# Patient Record
Sex: Female | Born: 1950 | Race: Black or African American | Hispanic: No | Marital: Single | Smoking: Never smoker
Health system: Southern US, Community
[De-identification: ages and names within clinical notes are randomized; demographics above are authoritative.]

## PROBLEM LIST (undated history)

## (undated) DIAGNOSIS — E785 Hyperlipidemia, unspecified: Secondary | ICD-10-CM

## (undated) DIAGNOSIS — E13319 Other specified diabetes mellitus with unspecified diabetic retinopathy without macular edema: Secondary | ICD-10-CM

## (undated) DIAGNOSIS — H5440 Blindness, one eye, unspecified eye: Secondary | ICD-10-CM

## (undated) DIAGNOSIS — G629 Polyneuropathy, unspecified: Secondary | ICD-10-CM

## (undated) DIAGNOSIS — I10 Essential (primary) hypertension: Secondary | ICD-10-CM

## (undated) DIAGNOSIS — E119 Type 2 diabetes mellitus without complications: Secondary | ICD-10-CM

## (undated) HISTORY — DX: Essential (primary) hypertension: I10

## (undated) HISTORY — DX: Hyperlipidemia, unspecified: E78.5

## (undated) HISTORY — DX: Other specified diabetes mellitus with unspecified diabetic retinopathy without macular edema: E13.319

## (undated) HISTORY — DX: Type 2 diabetes mellitus without complications: E11.9

## (undated) HISTORY — DX: Polyneuropathy, unspecified: G62.9

## (undated) HISTORY — DX: Blindness, one eye, unspecified eye: H54.40

---

## 1993-08-07 DIAGNOSIS — J45909 Unspecified asthma, uncomplicated: Secondary | ICD-10-CM | POA: Insufficient documentation

## 1998-08-07 DIAGNOSIS — I10 Essential (primary) hypertension: Secondary | ICD-10-CM | POA: Insufficient documentation

## 2001-08-07 DIAGNOSIS — K219 Gastro-esophageal reflux disease without esophagitis: Secondary | ICD-10-CM | POA: Insufficient documentation

## 2005-07-12 DIAGNOSIS — F329 Major depressive disorder, single episode, unspecified: Secondary | ICD-10-CM | POA: Insufficient documentation

## 2005-07-12 DIAGNOSIS — F32A Depression, unspecified: Secondary | ICD-10-CM | POA: Insufficient documentation

## 2005-07-12 DIAGNOSIS — G2581 Restless legs syndrome: Secondary | ICD-10-CM | POA: Insufficient documentation

## 2007-08-08 LAB — HM DIABETES FOOT EXAM: HM Diabetic Foot Exam: 0

## 2008-02-01 DIAGNOSIS — E785 Hyperlipidemia, unspecified: Secondary | ICD-10-CM | POA: Insufficient documentation

## 2008-05-14 ENCOUNTER — Ambulatory Visit: Payer: Self-pay | Admitting: Nurse Practitioner

## 2008-05-14 DIAGNOSIS — E1149 Type 2 diabetes mellitus with other diabetic neurological complication: Secondary | ICD-10-CM | POA: Insufficient documentation

## 2008-05-19 ENCOUNTER — Encounter (INDEPENDENT_AMBULATORY_CARE_PROVIDER_SITE_OTHER): Payer: Self-pay | Admitting: Nurse Practitioner

## 2008-05-26 ENCOUNTER — Encounter (INDEPENDENT_AMBULATORY_CARE_PROVIDER_SITE_OTHER): Payer: Self-pay | Admitting: Nurse Practitioner

## 2008-05-28 ENCOUNTER — Encounter (INDEPENDENT_AMBULATORY_CARE_PROVIDER_SITE_OTHER): Payer: Self-pay | Admitting: Nurse Practitioner

## 2008-05-28 DIAGNOSIS — H547 Unspecified visual loss: Secondary | ICD-10-CM | POA: Insufficient documentation

## 2008-05-29 ENCOUNTER — Encounter (INDEPENDENT_AMBULATORY_CARE_PROVIDER_SITE_OTHER): Payer: Self-pay | Admitting: Nurse Practitioner

## 2008-06-09 ENCOUNTER — Encounter (INDEPENDENT_AMBULATORY_CARE_PROVIDER_SITE_OTHER): Payer: Self-pay | Admitting: Nurse Practitioner

## 2008-06-09 ENCOUNTER — Ambulatory Visit: Payer: Self-pay | Admitting: Nurse Practitioner

## 2008-06-09 LAB — CONVERTED CEMR LAB
AST: 11 units/L (ref 0–37)
Alkaline Phosphatase: 81 units/L (ref 39–117)
Basophils Absolute: 0 10*3/uL (ref 0.0–0.1)
Blood Glucose, Fingerstick: 201
Blood in Urine, dipstick: NEGATIVE
Calcium: 9 mg/dL (ref 8.4–10.5)
Chloride: 96 meq/L (ref 96–112)
Creatinine, Ser: 0.75 mg/dL (ref 0.40–1.20)
Glucose, Bld: 177 mg/dL — ABNORMAL HIGH (ref 70–99)
Glucose, Urine, Semiquant: NEGATIVE
HCT: 41.9 % (ref 36.0–46.0)
LDL Cholesterol: 103 mg/dL — ABNORMAL HIGH (ref 0–99)
Lymphocytes Relative: 48 % — ABNORMAL HIGH (ref 12–46)
Lymphs Abs: 2.9 10*3/uL (ref 0.7–4.0)
MCHC: 33.7 g/dL (ref 30.0–36.0)
MCV: 86.4 fL (ref 78.0–100.0)
Microalb, Ur: 6.39 mg/dL — ABNORMAL HIGH (ref 0.00–1.89)
Monocytes Relative: 6 % (ref 3–12)
Protein, U semiquant: 100
RBC: 4.85 M/uL (ref 3.87–5.11)
RDW: 13.1 % (ref 11.5–15.5)
Total Bilirubin: 1.1 mg/dL (ref 0.3–1.2)
Triglycerides: 85 mg/dL (ref <150)
WBC: 6.1 10*3/uL (ref 4.0–10.5)

## 2008-06-10 ENCOUNTER — Encounter (INDEPENDENT_AMBULATORY_CARE_PROVIDER_SITE_OTHER): Payer: Self-pay | Admitting: Nurse Practitioner

## 2008-06-30 ENCOUNTER — Encounter (INDEPENDENT_AMBULATORY_CARE_PROVIDER_SITE_OTHER): Payer: Self-pay | Admitting: Nurse Practitioner

## 2008-08-05 ENCOUNTER — Ambulatory Visit: Payer: Self-pay | Admitting: Nurse Practitioner

## 2008-08-05 LAB — CONVERTED CEMR LAB
Blood Glucose, Fingerstick: 143
Hgb A1c MFr Bld: 7.6 %

## 2008-11-04 ENCOUNTER — Ambulatory Visit: Payer: Self-pay | Admitting: Nurse Practitioner

## 2008-11-04 LAB — CONVERTED CEMR LAB
Cholesterol, target level: 200 mg/dL
Hgb A1c MFr Bld: 6.4 %
LDL Goal: 100 mg/dL

## 2008-11-09 ENCOUNTER — Encounter (INDEPENDENT_AMBULATORY_CARE_PROVIDER_SITE_OTHER): Payer: Self-pay | Admitting: Nurse Practitioner

## 2008-11-09 ENCOUNTER — Ambulatory Visit: Payer: Self-pay | Admitting: Internal Medicine

## 2008-11-13 ENCOUNTER — Ambulatory Visit (HOSPITAL_COMMUNITY): Admission: RE | Admit: 2008-11-13 | Discharge: 2008-11-13 | Payer: Self-pay | Admitting: Family Medicine

## 2008-11-20 ENCOUNTER — Ambulatory Visit: Payer: Self-pay | Admitting: Nurse Practitioner

## 2008-11-20 LAB — CONVERTED CEMR LAB
ALT: 15 units/L (ref 0–35)
Albumin: 3.7 g/dL (ref 3.5–5.2)
Alkaline Phosphatase: 70 units/L (ref 39–117)
Basophils Absolute: 0 10*3/uL (ref 0.0–0.1)
Basophils Relative: 1 % (ref 0–1)
Calcium: 9 mg/dL (ref 8.4–10.5)
HDL: 53 mg/dL (ref >39)
Hemoglobin: 12.9 g/dL (ref 12.0–15.0)
LDL Cholesterol: 66 mg/dL (ref 0–99)
Lymphs Abs: 2 10*3/uL (ref 0.7–4.0)
Monocytes Absolute: 0.4 10*3/uL (ref 0.1–1.0)
Platelets: 299 10*3/uL (ref 150–400)
Sodium: 142 meq/L (ref 135–145)
Total CHOL/HDL Ratio: 2.4
Triglycerides: 49 mg/dL (ref <150)
WBC: 4.7 10*3/uL (ref 4.0–10.5)

## 2008-11-23 ENCOUNTER — Encounter (INDEPENDENT_AMBULATORY_CARE_PROVIDER_SITE_OTHER): Payer: Self-pay | Admitting: Nurse Practitioner

## 2008-11-30 ENCOUNTER — Encounter (INDEPENDENT_AMBULATORY_CARE_PROVIDER_SITE_OTHER): Payer: Self-pay | Admitting: Nurse Practitioner

## 2008-12-28 ENCOUNTER — Ambulatory Visit: Payer: Self-pay | Admitting: Nurse Practitioner

## 2009-02-09 ENCOUNTER — Ambulatory Visit: Payer: Self-pay | Admitting: Nurse Practitioner

## 2009-02-09 LAB — CONVERTED CEMR LAB: Blood Glucose, Fingerstick: 112

## 2009-02-23 ENCOUNTER — Encounter (INDEPENDENT_AMBULATORY_CARE_PROVIDER_SITE_OTHER): Payer: Self-pay | Admitting: Nurse Practitioner

## 2009-02-24 ENCOUNTER — Encounter (INDEPENDENT_AMBULATORY_CARE_PROVIDER_SITE_OTHER): Payer: Self-pay | Admitting: Nurse Practitioner

## 2009-04-06 ENCOUNTER — Telehealth (INDEPENDENT_AMBULATORY_CARE_PROVIDER_SITE_OTHER): Payer: Self-pay | Admitting: *Deleted

## 2009-04-07 ENCOUNTER — Encounter (INDEPENDENT_AMBULATORY_CARE_PROVIDER_SITE_OTHER): Payer: Self-pay | Admitting: Nurse Practitioner

## 2009-04-15 ENCOUNTER — Encounter (INDEPENDENT_AMBULATORY_CARE_PROVIDER_SITE_OTHER): Payer: Self-pay | Admitting: Nurse Practitioner

## 2009-05-20 ENCOUNTER — Telehealth (INDEPENDENT_AMBULATORY_CARE_PROVIDER_SITE_OTHER): Payer: Self-pay | Admitting: Nurse Practitioner

## 2009-08-07 LAB — HM PAP SMEAR

## 2009-09-07 LAB — HM MAMMOGRAPHY: HM Mammogram: NORMAL

## 2009-11-24 ENCOUNTER — Ambulatory Visit: Payer: Self-pay | Admitting: Nurse Practitioner

## 2009-11-24 LAB — CONVERTED CEMR LAB
Bilirubin Urine: NEGATIVE
Blood in Urine, dipstick: NEGATIVE
Glucose, Urine, Semiquant: NEGATIVE
Rapid HIV Screen: NEGATIVE
Specific Gravity, Urine: 1.02

## 2009-11-25 ENCOUNTER — Encounter (INDEPENDENT_AMBULATORY_CARE_PROVIDER_SITE_OTHER): Payer: Self-pay | Admitting: Nurse Practitioner

## 2009-11-25 LAB — CONVERTED CEMR LAB
Albumin: 4 g/dL (ref 3.5–5.2)
Alkaline Phosphatase: 93 units/L (ref 39–117)
BUN: 11 mg/dL (ref 6–23)
Basophils Relative: 1 % (ref 0–1)
CO2: 26 meq/L (ref 19–32)
Chloride: 103 meq/L (ref 96–112)
Creatinine, Ser: 0.71 mg/dL (ref 0.40–1.20)
Eosinophils Relative: 1 % (ref 0–5)
LDL Cholesterol: 65 mg/dL (ref 0–99)
MCHC: 33.7 g/dL (ref 30.0–36.0)
MCV: 85.4 fL (ref 78.0–100.0)
Microalb, Ur: 10.97 mg/dL — ABNORMAL HIGH (ref 0.00–1.89)
Neutro Abs: 2.6 10*3/uL (ref 1.7–7.7)
Neutrophils Relative %: 52 % (ref 43–77)
Potassium: 4.2 meq/L (ref 3.5–5.3)
RBC: 5.07 M/uL (ref 3.87–5.11)
Sodium: 141 meq/L (ref 135–145)
Total Protein: 7.1 g/dL (ref 6.0–8.3)

## 2009-12-22 ENCOUNTER — Ambulatory Visit: Payer: Self-pay | Admitting: Nurse Practitioner

## 2009-12-22 LAB — CONVERTED CEMR LAB
Blood Glucose, Fingerstick: 249
Ketones, urine, test strip: NEGATIVE
Protein, U semiquant: 30
Specific Gravity, Urine: 1.025
WBC Urine, dipstick: NEGATIVE
pH: 5.5

## 2009-12-28 ENCOUNTER — Encounter (INDEPENDENT_AMBULATORY_CARE_PROVIDER_SITE_OTHER): Payer: Self-pay | Admitting: Nurse Practitioner

## 2009-12-31 ENCOUNTER — Ambulatory Visit (HOSPITAL_COMMUNITY): Admission: RE | Admit: 2009-12-31 | Discharge: 2009-12-31 | Payer: Self-pay | Admitting: Internal Medicine

## 2010-03-24 ENCOUNTER — Telehealth (INDEPENDENT_AMBULATORY_CARE_PROVIDER_SITE_OTHER): Payer: Self-pay | Admitting: Nurse Practitioner

## 2010-04-06 ENCOUNTER — Ambulatory Visit: Payer: Self-pay | Admitting: Nurse Practitioner

## 2010-04-06 ENCOUNTER — Telehealth (INDEPENDENT_AMBULATORY_CARE_PROVIDER_SITE_OTHER): Payer: Self-pay | Admitting: Nurse Practitioner

## 2010-04-06 DIAGNOSIS — R55 Syncope and collapse: Secondary | ICD-10-CM | POA: Insufficient documentation

## 2010-04-06 LAB — CONVERTED CEMR LAB: Blood Glucose, Fingerstick: 139

## 2010-05-11 ENCOUNTER — Ambulatory Visit: Payer: Self-pay | Admitting: Nurse Practitioner

## 2010-05-11 LAB — CONVERTED CEMR LAB: Blood Glucose, Fingerstick: 185

## 2010-08-05 ENCOUNTER — Telehealth (INDEPENDENT_AMBULATORY_CARE_PROVIDER_SITE_OTHER): Payer: Self-pay | Admitting: Nurse Practitioner

## 2010-09-06 NOTE — Assessment & Plan Note (Signed)
Summary: S/p Syncopal Episode   Vital Signs:  Patient profile:   60 year old female Menstrual status:  postmenopausal Weight:      229.3 pounds BMI:     37.14 BSA:     2.12 Temp:     97.5 degrees F oral Pulse rate:   80 / minute Pulse rhythm:   regular Resp:     20 per minute BP sitting:   160 / 104  (left arm) Cuff size:   regular  Vitals Entered By: Levon Hedger (April 06, 2010 2:38 PM)  Nutrition Counseling: Patient's BMI is greater than 25 and therefore counseled on weight management options. CC: pt states yesterday she had an episode, she said her sister  were having dinner and she went out in a blank place and her sister took her to  the hospital to see what was going on  because she was feeling very weak, Hypertension Management, Lipid Management Is Patient Diabetic? Yes Pain Assessment Patient in pain? no      CBG Result 139 CBG Device ID B  Does patient need assistance? Functional Status Self care Ambulation Normal   CC:  pt states yesterday she had an episode, she said her sister  were having dinner and she went out in a blank place and her sister took her to  the hospital to see what was going on  because she was feeling very weak, Hypertension Management, and Lipid Management.  History of Present Illness: Pt into the office s/p an ER visit. Pt reports that she was eating on last night and started to feel weak. She felt like she was going to faint. Pt denies any pain or abnormal feeling then all of a sudden she felt weak.  No sweating or clamminess Pt reports that she has eating and drinking normal yesterday. No extreme heat. Pt went to the hospital - ER [facility]) Head CT, CXRAY, EKG done with all normal reports per pt Dx with syncope Pt was discharged on last night then went home to sleep pt awoke with today and she was in usual state of health.   Eating and drinking fine. No syncope No weakness No nausea and  vomiting  Obesity - 4 pounds weight gain since her last visit  Hypertension History:      She denies headache, chest pain, and palpitations.  Pt's blood pressure was elevated today and on last visit.  She is also notes that her blood pressure was elevated in the ER on last night.  Pt admits that she has been without her BP meds for 3 days and she is waiting to get it from the pharmacy.        Positive major cardiovascular risk factors include female age 6 years old or older, diabetes, hyperlipidemia, and hypertension.  Negative major cardiovascular risk factors include non-tobacco-user status.        Further assessment for target organ damage reveals no history of ASHD, cardiac end-organ damage (CHF/LVH), stroke/TIA, peripheral vascular disease, renal insufficiency, or hypertensive retinopathy.    Lipid Management History:      Positive NCEP/ATP III risk factors include female age 4 years old or older, diabetes, and hypertension.  Negative NCEP/ATP III risk factors include non-tobacco-user status, no ASHD (atherosclerotic heart disease), no prior stroke/TIA, no peripheral vascular disease, and no history of aortic aneurysm.        The patient states that she knows about the "Therapeutic Lifestyle Change" diet.  Her compliance with  the TLC diet is good.  She expresses no side effects from her lipid-lowering medication.  The patient denies any symptoms to suggest myopathy or liver disease.      Allergies (verified): No Known Drug Allergies  Review of Systems General:  Denies fatigue. CV:  Denies chest pain or discomfort. GI:  Denies abdominal pain, nausea, and vomiting.  Physical Exam  General:  alert.   Head:  normocephalic.   Lungs:  normal breath sounds.   Heart:  normal rate and regular rhythm.   Abdomen:  normal bowel sounds.   Neurologic:  cane use Psych:  Oriented X3.     Impression & Recommendations:  Problem # 1:  SYNCOPE (ICD-780.2) syncope reviewed with pt  Problem #  2:  HYPERTENSION, BENIGN ESSENTIAL (ICD-401.1) BP is elevated today advised pt to get medications today when she leaves this office Her updated medication list for this problem includes:    Hydrochlorothiazide 25 Mg Tabs (Hydrochlorothiazide) .Marland Kitchen... Take one (1) by mouth daily for blood pressure    Diovan 160 Mg Tabs (Valsartan) .Marland Kitchen... 1 tablet by mouth daily for blood pressure  Problem # 3:  DIABETES MELLITUS (ICD-250.00) continue current medications Her updated medication list for this problem includes:    Adult Aspirin Ec Low Strength 81 Mg Tbec (Aspirin) .Marland Kitchen... Take one tablet once daily    Glucophage 1000 Mg Tabs (Metformin hcl) .Marland Kitchen... 1 tablet by mouth two times a day for blood sugar    Januvia 100 Mg Tabs (Sitagliptin phosphate) .Marland Kitchen... 1 tablet by mouth daily for diabetes    Diovan 160 Mg Tabs (Valsartan) .Marland Kitchen... 1 tablet by mouth daily for blood pressure  Orders: Capillary Blood Glucose/CBG (54098)  Complete Medication List: 1)  Adult Aspirin Ec Low Strength 81 Mg Tbec (Aspirin) .... Take one tablet once daily 2)  Nortriptyline Hcl 25 Mg Caps (Nortriptyline hcl) .... Take one capsule at bedtime 3)  Gabapentin 300 Mg Caps (Gabapentin) .... 2 capsules by mouth nightly 4)  Nexium 40 Mg Cpdr (Esomeprazole magnesium) .... Take one capsule daily for stomach 5)  Advair Diskus 250-50 Mcg/dose Misc (Fluticasone-salmeterol) .Marland Kitchen.. 1 puff twice daily for breathing 6)  Hydrochlorothiazide 25 Mg Tabs (Hydrochlorothiazide) .... Take one (1) by mouth daily for blood pressure 7)  Glucophage 1000 Mg Tabs (Metformin hcl) .Marland Kitchen.. 1 tablet by mouth two times a day for blood sugar 8)  Multivitamins Tabs (Multiple vitamin) .Marland Kitchen.. 1 tablet by mouth daily 9)  Glucometer Elite Classic Kit (Blood glucose monitoring suppl) .... Check blood sugar daily dx 250.00 dispense lancets and test strips, glucometer 10)  Januvia 100 Mg Tabs (Sitagliptin phosphate) .Marland Kitchen.. 1 tablet by mouth daily for diabetes 11)  Lipitor 10 Mg Tabs  (Atorvastatin calcium) .Marland Kitchen.. 1 tablet by mouth nightly for cholesterol 12)  Proventil Hfa 108 (90 Base) Mcg/act Aers (Albuterol sulfate) .... 2 puffs every 6 hours as needed for shortness of breath 13)  Diovan 160 Mg Tabs (Valsartan) .Marland Kitchen.. 1 tablet by mouth daily for blood pressure  Hypertension Assessment/Plan:      The patient's hypertensive risk group is category C: Target organ damage and/or diabetes.  Her calculated 10 year risk of coronary heart disease is 20 %.  Today's blood pressure is 160/104.  Her blood pressure goal is < 130/80.  Lipid Assessment/Plan:      Based on NCEP/ATP III, the patient's risk factor category is "history of diabetes".  The patient's lipid goals are as follows: Total cholesterol goal is 200; LDL cholesterol goal is 100;  HDL cholesterol goal is 40; Triglyceride goal is 150.    Patient Instructions: 1)  Symptoms on yesterday sounds like a syncopal episode.  There are several things that cause these episodes which are usually resolved in a few minutes with a return to normal feeling in minutes. 2)  Blood pressure - elevated today 3)  BE SURE YOU GET YOUR MEDICATIONS TODAY. Your blood pressure medications do specific things in your body and when you don't have this medication then your body is under stress 4)  Schedule a follow up appointment during the 1st week in October for diabetes. 5)  You will need a flu vaccine then.

## 2010-09-06 NOTE — Progress Notes (Signed)
Summary: [facility] NEEDS NEW RX FOR DIOVAN  Phone Note Call from Patient Call back at Home Phone [phone]   Reason for Call: Refill Medication Summary of Call: martin pt. ms hamilton called and says that [facility] they didn't have the diovan and now the diovan needs to be calledin. they need a new RX. Initial call taken by: Leodis Rains,  April 06, 2010 4:57 PM  Follow-up for Phone Call        Called [facility] -- confirmed Rx-states pt. received meds.  Dutch Quint RN  April 07, 2010 10:18 AM

## 2010-09-06 NOTE — Assessment & Plan Note (Signed)
Summary: Diabetes   Vital Signs:  Patient profile:   60 year old female Menstrual status:  postmenopausal Weight:      229.9 pounds Temp:     97.4 degrees F oral Pulse rate:   66 / minute Pulse rhythm:   regular Resp:     16 per minute BP sitting:   166 / 98  (left arm) Cuff size:   regular  Vitals Entered By: Levon Hedger (May 11, 2010 12:41 PM) CC: follow-up visit DM...flu shot, Hypertension Management, Lipid Management, Abdominal Pain Is Patient Diabetic? No Pain Assessment Patient in pain? no      CBG Result 185 CBG Device ID B  Does patient need assistance? Functional Status Self care Ambulation Normal   CC:  follow-up visit DM...flu shot, Hypertension Management, Lipid Management, and Abdominal Pain.  History of Present Illness:  Pt into the office for diabetes f/u  Diabetes Management History:      The patient is a 60 years old female who comes in for evaluation of Type 2 Diabetes Mellitus.  She has not been enrolled in the "Diabetic Education Program".  She states understanding of dietary principles and is following her diet appropriately.  Sensory loss is noted.  Self foot exams are not being performed.  She is checking home blood sugars.  She says that she is exercising.  Type of exercise includes: walking.        Hypoglycemic symptoms are not occurring.  No hyperglycemic symptoms are reported.        There are no symptoms to suggest diabetic complications.  No changes have been made to her treatment plan since last visit.    Dyspepsia History:      She has no alarm features of dyspepsia including no history of melena, hematochezia, dysphagia, persistent vomiting, or involuntary weight loss > 5%.  There is a prior history of GERD.  The patient does not have a prior history of documented ulcer disease.  The dominant symptom is not heartburn or acid reflux.  An H-2 blocker medication is not currently being taken.  No previous upper endoscopy has been done.      Hypertension History:      She denies headache, chest pain, and palpitations.  Pt is taking her medications as ordered.        Positive major cardiovascular risk factors include female age 56 years old or older, diabetes, hyperlipidemia, and hypertension.  Negative major cardiovascular risk factors include non-tobacco-user status.        Further assessment for target organ damage reveals no history of ASHD, cardiac end-organ damage (CHF/LVH), stroke/TIA, peripheral vascular disease, renal insufficiency, or hypertensive retinopathy.    Lipid Management History:      Positive NCEP/ATP III risk factors include female age 66 years old or older, diabetes, and hypertension.  Negative NCEP/ATP III risk factors include non-tobacco-user status, no ASHD (atherosclerotic heart disease), no prior stroke/TIA, no peripheral vascular disease, and no history of aortic aneurysm.        The patient states that she knows about the "Therapeutic Lifestyle Change" diet.  Her compliance with the TLC diet is good.  The patient does not know about adjunctive measures for cholesterol lowering.  She expresses no side effects from her lipid-lowering medication.  The patient denies any symptoms to suggest myopathy or liver disease.       Medications Prior to Update: 1)  Adult Aspirin Ec Low Strength 81 Mg Tbec (Aspirin) .... Take One Tablet Once Daily  2)  Nortriptyline Hcl 25 Mg Caps (Nortriptyline Hcl) .... Take One Capsule At Bedtime 3)  Gabapentin 300 Mg Caps (Gabapentin) .... 2 Capsules By Mouth Nightly 4)  Nexium 40 Mg Cpdr (Esomeprazole Magnesium) .... Take One Capsule Daily For Stomach 5)  Advair Diskus 250-50 Mcg/dose Misc (Fluticasone-Salmeterol) .Marland Kitchen.. 1 Puff Twice Daily For Breathing 6)  Hydrochlorothiazide 25 Mg Tabs (Hydrochlorothiazide) .... Take One (1) By Mouth Daily For Blood Pressure 7)  Glucophage 1000 Mg Tabs (Metformin Hcl) .Marland Kitchen.. 1 Tablet By Mouth Two Times A Day For Blood Sugar 8)  Multivitamins  Tabs  (Multiple Vitamin) .Marland Kitchen.. 1 Tablet By Mouth Daily 9)  Glucometer Elite Classic  Kit (Blood Glucose Monitoring Suppl) .... Check Blood Sugar Daily Dx 250.00 Dispense Lancets and Test Strips, Glucometer 10)  Januvia 100 Mg Tabs (Sitagliptin Phosphate) .Marland Kitchen.. 1 Tablet By Mouth Daily For Diabetes 11)  Lipitor 10 Mg Tabs (Atorvastatin Calcium) .Marland Kitchen.. 1 Tablet By Mouth Nightly For Cholesterol 12)  Proventil Hfa 108 (90 Base) Mcg/act Aers (Albuterol Sulfate) .... 2 Puffs Every 6 Hours As Needed For Shortness of Breath 13)  Diovan 160 Mg Tabs (Valsartan) .Marland Kitchen.. 1 Tablet By Mouth Daily For Blood Pressure  Allergies (verified): No Known Drug Allergies  Review of Systems General:  Denies fever. CV:  Denies chest pain or discomfort. Resp:  Denies cough. GI:  Denies abdominal pain, nausea, and vomiting. MS:  Complains of joint pain.  Physical Exam  General:  alert.   Head:  normocephalic.   Lungs:  normal breath sounds.   Heart:  normal rate and regular rhythm.   Abdomen:  normal bowel sounds.   Neurologic:  cane use   Impression & Recommendations:  Problem # 1:  DIABETES MELLITUS (ICD-250.00) will check Hgba1c today advised pt that is elevated will need insulin  Her updated medication list for this problem includes:    Adult Aspirin Ec Low Strength 81 Mg Tbec (Aspirin) .Marland Kitchen... Take one tablet once daily    Glucophage 1000 Mg Tabs (Metformin hcl) .Marland Kitchen... 1 tablet by mouth two times a day for blood sugar    Januvia 100 Mg Tabs (Sitagliptin phosphate) .Marland Kitchen... 1 tablet by mouth daily for diabetes    Diovan 320 Mg Tabs (Valsartan) ..... One tablet by mouth daily for blood pressure  **note change in dose**  Orders: Capillary Blood Glucose/CBG (82948) T- Hemoglobin A1C (78295-62130)  Problem # 2:  HYPERTENSION, BENIGN ESSENTIAL (ICD-401.1) bp is elevated will increase diovan to 320mg  Her updated medication list for this problem includes:    Hydrochlorothiazide 25 Mg Tabs (Hydrochlorothiazide) .Marland Kitchen... Take  one (1) by mouth daily for blood pressure    Diovan 320 Mg Tabs (Valsartan) ..... One tablet by mouth daily for blood pressure  **note change in dose**  Problem # 3:  GERD (ICD-530.81)  Her updated medication list for this problem includes:    Nexium 40 Mg Cpdr (Esomeprazole magnesium) .Marland Kitchen... Take one capsule daily for stomach  Problem # 4:  DYSLIPIDEMIA (ICD-272.4)  Her updated medication list for this problem includes:    Lipitor 10 Mg Tabs (Atorvastatin calcium) .Marland Kitchen... 1 tablet by mouth nightly for cholesterol  Problem # 5:  ASTHMA (ICD-493.90)  The following medications were removed from the medication list:    Advair Diskus 250-50 Mcg/dose Misc (Fluticasone-salmeterol) .Marland Kitchen... 1 puff twice daily for breathing Her updated medication list for this problem includes:    Proventil Hfa 108 (90 Base) Mcg/act Aers (Albuterol sulfate) .Marland Kitchen... 2 puffs every 6 hours as needed for  shortness of breath  Problem # 6:  NEED PROPHYLACTIC VACCINATION&INOCULATION FLU (ICD-V04.81)  flu vaccine given today  Orders: Admin 1st Vaccine (19147) Flu Vaccine 62yrs + MEDICARE PATIENTS (W2956)  Complete Medication List: 1)  Adult Aspirin Ec Low Strength 81 Mg Tbec (Aspirin) .... Take one tablet once daily 2)  Nortriptyline Hcl 25 Mg Caps (Nortriptyline hcl) .... Take one capsule at bedtime 3)  Gabapentin 300 Mg Caps (Gabapentin) .... 2 capsules by mouth nightly 4)  Nexium 40 Mg Cpdr (Esomeprazole magnesium) .... Take one capsule daily for stomach 5)  Hydrochlorothiazide 25 Mg Tabs (Hydrochlorothiazide) .... Take one (1) by mouth daily for blood pressure 6)  Glucophage 1000 Mg Tabs (Metformin hcl) .Marland Kitchen.. 1 tablet by mouth two times a day for blood sugar 7)  Multivitamins Tabs (Multiple vitamin) .Marland Kitchen.. 1 tablet by mouth daily 8)  Glucometer Elite Classic Kit (Blood glucose monitoring suppl) .... Check blood sugar daily dx 250.00 dispense lancets and test strips, glucometer 9)  Januvia 100 Mg Tabs (Sitagliptin  phosphate) .Marland Kitchen.. 1 tablet by mouth daily for diabetes 10)  Lipitor 10 Mg Tabs (Atorvastatin calcium) .Marland Kitchen.. 1 tablet by mouth nightly for cholesterol 11)  Proventil Hfa 108 (90 Base) Mcg/act Aers (Albuterol sulfate) .... 2 puffs every 6 hours as needed for shortness of breath 12)  Diovan 320 Mg Tabs (Valsartan) .... One tablet by mouth daily for blood pressure  **note change in dose**  Diabetes Management Assessment/Plan:      The following lipid goals have been established for the patient: Total cholesterol goal of 200; LDL cholesterol goal of 100; HDL cholesterol goal of 40; Triglyceride goal of 150.  Her blood pressure goal is < 130/80.    Hypertension Assessment/Plan:      The patient's hypertensive risk group is category C: Target organ damage and/or diabetes.  Her calculated 10 year risk of coronary heart disease is 20 %.  Today's blood pressure is 166/98.  Her blood pressure goal is < 130/80.  Lipid Assessment/Plan:      Based on NCEP/ATP III, the patient's risk factor category is "history of diabetes".  The patient's lipid goals are as follows: Total cholesterol goal is 200; LDL cholesterol goal is 100; HDL cholesterol goal is 40; Triglyceride goal is 150.  Her LDL cholesterol goal has been met.    Patient Instructions: 1)  Your Hgba1c will be checked today and you will be notifed of the results. 2)  You have received your flu vaccine today 3)  Blood pressure is still elevated.  Take diovan 320mg  by mouth daily - increase dose. 4)  Follow up in 4 months for diabetes or sooner if necessary 5)  will need hgba1c, cbg, u/a. Prescriptions: DIOVAN 320 MG TABS (VALSARTAN) One tablet by mouth daily for blood pressure  **Note change in dose**  #30 x 5   Entered and Authorized by:   Lehman Prom FNP   Signed by:   Lehman Prom FNP on 05/11/2010   Method used:   Printed then faxed to ...       [facility] (retail)       [address] [place], [place]   [address]       Ph: [phone]       Fax: [phone]   RxID:   [id number]   Appended Document: Diabetes  Diabetes Management History:      The patient is a 60 years old female who comes in for evaluation  of Type 2 Diabetes Mellitus.  She has not been enrolled in the "Diabetic Education Program".  She is checking home blood sugars.  She says that she is exercising.  Type of exercise includes: walking.    Diabetes Management Assessment/Plan:      The following lipid goals have been established for the patient: Total cholesterol goal of 200; LDL cholesterol goal of 100; HDL cholesterol goal of 40; Triglyceride goal of 150.  Her blood pressure goal is < 130/80.    Diabetic Foot Exam Foot Inspection Is there a history of a foot ulcer?              No Is there a foot ulcer now?              No Can the patient see the bottom of their feet?          No Are the shoes appropriate in style and fit?          Yes Is there swelling or an abnormal foot shape?          No Are the toenails long?                No Are the toenails thick?                No Are the toenails ingrown?              No Is there heavy callous build-up?              No Is there pain in the calf muscle (Intermittent claudication) when walking?    NoIs there a claw toe deformity?              Yes Is there elevated skin temperature?            No Is there limited ankle dorsiflexion?            No Is there foot or ankle muscle weakness?            No  Diabetic Foot Care Education Patient educated on appropriate care of diabetic feet.  Pulse Check          Right Foot          Left Foot Dorsalis Pedis:        normal            normal     Clinical Lists Changes  Observations: Added new observation of QD FTINSPECT: Done (05/11/2010 18:36) Added new observation of PEDAL PULS L: normal (05/11/2010 18:36) Added new observation of PEDAL PULS R: normal (05/11/2010 18:36)      Appended Document: Diabetes   Influenza  Vaccine    Vaccine Type: Fluvax 3+    Site: left deltoid    Mfr: Gl[employer]oSmithKline    Dose: 0.5 ml    Route: IM    Given by: [place] Shannon    Exp. Date: 01/2011    Lot #: [id number]    VIS given: 03/01/10 version given May 12, 2010.  Flu Vaccine Consent Questions    Do you have a history of severe allergic reactions to this vaccine? no    Any prior history of allergic reactions to egg and/or gelatin? no    Do you have a sensitivity to the preservative Thimersol? no    Do you have a past history of Guillan-Barre Syndrome? no    Do you currently have an acute febrile illness? no  Have you ever had a severe reaction to latex? no    Vaccine information given and explained to patient? yes    Are you currently pregnant? no

## 2010-09-06 NOTE — Letter (Signed)
Summary: *HSN Results Follow up  [facility]  [address] [place], [place] [address]   Phone: [phone]  Fax: [phone]      12/28/2009   Reeya FOY-HAMILTON [address] [place], [place]  [address]   Dear  Ms. Harley FOY-HAMILTON,                            ____S.Drinkard,FNP   ____D. Gore,FNP       ____B. McPherson,MD   ____V. Rankins,MD    ____E. Mulberry,MD    _X___N. Daphine Deutscher, FNP  ____D. Reche Dixon, MD    ____K. Philipp Deputy, MD    ____Other     This letter is to inform you that your recent test(s):  ____X___Pap Smear    ___X____Lab Test     _______X-ray    ___X____ is within acceptable limits  _______ requires a medication change  _______ requires a follow-up lab visit  _______ requires a follow-up visit with your provider   Comments: Pap smear and labs done during recent office visit are normal.       _________________________________________________________ If you have any questions, please contact our office [phone].                    Sincerely,    Lehman Prom FNP [facility]

## 2010-09-06 NOTE — Letter (Signed)
Summary: *HSN Results Follow up  [facility]  [address] [place], [place] [address]   Phone: [phone]  Fax: [phone]      11/25/2009   Alicia Sampson [address] [place], [place]  [address]   Dear  [name],                            ____S.Drinkard,FNP   ____D. Gore,FNP       ____B. McPherson,MD   ____V. Rankins,MD    ____E. Mulberry,MD    _X___N. Daphine Deutscher, FNP  ____D. Reche Dixon, MD    ____K. Philipp Deputy, MD    ____Other     This letter is to inform you that your recent test(s):  _______Pap Smear    ___X____Lab Test     _______X-ray    ____X___ is within acceptable limits  _______ requires a medication change  _______ requires a follow-up lab visit  _______ requires a follow-up visit with your provider   Comments: Labs done during recent office visit normal except blood sugar is high.  We discussed this during your visit.  Take your medication as ordered and re-start your diabetic diet.  Also your kidneys are working hard trying to get rid of the extra sugar in your body. This is more reason for you to get your blood sugar under good control      _________________________________________________________ If you have any questions, please contact our office [phone].                    Sincerely,    Lehman Prom FNP [facility]

## 2010-09-06 NOTE — Assessment & Plan Note (Signed)
Summary: Complete Physical Exam   Vital Signs:  Patient profile:   60 year old female Menstrual status:  postmenopausal Height:      66 inches Weight:      225.5 pounds BMI:     36.53 BSA:     2.11 Temp:     98.0 degrees F oral Pulse rate:   79 / minute Pulse rhythm:   regular Resp:     20 per minute BP sitting:   168 / 101  (left arm) Cuff size:   regular  Vitals Entered ByLevon Hedger (Dec 22, 2009 8:30 AM) CC: CPP, Hypertension Management Is Patient Diabetic? Yes CBG Result 249 CBG Device ID A  Does patient need assistance? Functional Status Self care Ambulation Normal, Impaired:Risk for fall LMP - Character: post menopausal     Menstrual Status postmenopausal   CC:  CPP and Hypertension Management.  History of Present Illness:  Pt into the office for a complete physical exam  PAP - Last pap was 3 years ago. Pt has not had a hysterectomy.  Post menopausal.  LMP 5 years ago. No family history of cervial or ovarian cancer  Mammogram - last mammogram over 1 year ago no breast cancer in family self breast exams at home  Optho - Retasure done at [facility] April 2010 and pt did not f/u with optho  [facility]- pt had a visit 2 weeks ago for routine check. she did get x-rays and was noted to have infection. She was started on antibiotics (amoxil) and pt will finish today. She will go back for f/u  Immuization - up to date  Diabetes Management History:      The patient is a 60 years old female who comes in for evaluation of DM Type 2.  She has not been enrolled in the "Diabetic Education Program".  She states understanding of dietary principles and is following her diet appropriately.  Sensory loss is noted.  Self foot exams are not being performed.  She is checking home blood sugars.  She says that she is exercising.  Type of exercise includes: walking.        Hypoglycemic symptoms are not occurring.  No hyperglycemic symptoms are reported.        Symptoms which  suggest diabetic complications include paresthesias.  No changes have been made to her treatment plan since last visit.    Hypertension History:      She denies headache, chest pain, and palpitations.  She notes no problems with any antihypertensive medication side effects.  No medications yet today.        Positive major cardiovascular risk factors include female age 19 years old or older, diabetes, hyperlipidemia, and hypertension.  Negative major cardiovascular risk factors include non-tobacco-user status.        Further assessment for target organ damage reveals no history of ASHD, cardiac end-organ damage (CHF/LVH), stroke/TIA, peripheral vascular disease, renal insufficiency, or hypertensive retinopathy.        Habits & Providers  Alcohol-Tobacco-Diet     Alcohol drinks/day: 0     Tobacco Status: never  Exercise-Depression-Behavior     Does Patient Exercise: yes     Exercise Counseling: to improve exercise regimen     Type of exercise: walking     Have you felt down or hopeless? no     Have you felt little pleasure in things? no     Depression Counseling: not indicated; screening negative for depression     Drug Use:  no     Seat Belt Use: 75     Sun Exposure: occasionally  Comments: PHQ-9 score = 2  Medications Prior to Update: 1)  Adult Aspirin Ec Low Strength 81 Mg Tbec (Aspirin) .... Take One Tablet Once Daily 2)  Nortriptyline Hcl 25 Mg Caps (Nortriptyline Hcl) .... Take One Capsule At Bedtime 3)  Gabapentin 300 Mg Caps (Gabapentin) .... 2 Capsules By Mouth Nightly 4)  Nexium 40 Mg Cpdr (Esomeprazole Magnesium) .... Take One Capsule Daily For Stomach 5)  Advair Diskus 250-50 Mcg/dose Misc (Fluticasone-Salmeterol) .Marland Kitchen.. 1 Puff Twice Daily For Breathing 6)  Hydrochlorothiazide 25 Mg Tabs (Hydrochlorothiazide) .... Take One (1) By Mouth Daily For Blood Pressure 7)  Glucophage 1000 Mg Tabs (Metformin Hcl) .Marland Kitchen.. 1 Tablet By Mouth Two Times A Day For Blood Sugar 8)   Multivitamins  Tabs (Multiple Vitamin) .Marland Kitchen.. 1 Tablet By Mouth Daily 9)  Glucometer Elite Classic  Kit (Blood Glucose Monitoring Suppl) .... Check Blood Sugar Daily Dx 250.00 Dispense Lancets and Test Strips, Glucometer 10)  Januvia 100 Mg Tabs (Sitagliptin Phosphate) .Marland Kitchen.. 1 Tablet By Mouth Daily For Diabetes 11)  Lipitor 10 Mg Tabs (Atorvastatin Calcium) .Marland Kitchen.. 1 Tablet By Mouth Nightly For Cholesterol 12)  Proventil Hfa 108 (90 Base) Mcg/act Aers (Albuterol Sulfate) .... 2 Puffs Every 6 Hours As Needed For Shortness of Breath 13)  Diovan 160 Mg Tabs (Valsartan) .Marland Kitchen.. 1 Tablet By Mouth Daily For Blood Pressure  Allergies (verified): No Known Drug Allergies  Review of Systems General:  Denies fever. Eyes:  Denies blurring. ENT:  Denies decreased hearing and ear discharge; itchy ears. CV:  Denies chest pain or discomfort. Resp:  Denies cough. GI:  Denies abdominal pain, constipation, nausea, and vomiting. GU:  Denies discharge. MS:  Complains of joint pain. Derm:  Denies lesion(s). Neuro:  Complains of tingling; denies headaches; bil feet. Psych:  Denies anxiety and depression. Endo:  Denies excessive urination.  Physical Exam  General:  alert.   Head:  normocephalic.   Eyes:  pupils round.   Ears:  bil TM visible - minimal cerumen bilaterally Nose:  no nasal discharge.   Mouth:  fair dentition.  evidence of caries Neck:  no masses.   Chest Wall:  no mass.   Breasts:  no masses.   Lungs:  normal breath sounds.   Heart:  normal rate and regular rhythm.   Abdomen:  soft, non-tender, and normal bowel sounds.   Rectal:  no masses.  guaiac negative Msk:  up to the exam table Pulses:  R radial normal and L radial normal.   Extremities:  no edema Neurologic:  alert & oriented X3.   limping gait - cane Skin:  dry Psych:  Oriented X3.    Pelvic Exam  Vulva:      normal appearance.   Urethra and Bladder:      Urethra--normal.   Vagina:      post-menopausal.  scant yellow  discharge Cervix:      midposition.   Uterus:      smooth.   Adnexa:      nontender bilaterally.   Rectum:      normal, heme negative stool.    Diabetes Management Exam:    Foot Exam (with socks and/or shoes not present):       Inspection:          Left foot: normal          Right foot: normal       Nails:  Left foot: normal          Right foot: normal    Impression & Recommendations:  Problem # 1:  ROUTINE GYNECOLOGICAL EXAMINATION (ICD-V72.31) PHQ-9 score = 2 labs done from previous visit PAP done EKG done recommend optho exam maintain dental f.u guaiac negative colonscopy up to date immunizations up to date Orders: KOH/ WET Mount [phone]) Pap Smear, Thin Prep ( Collection of) [phone]) T- GC Chlamydia (09811) Hemoccult Guaiac-1 spec.(in office) (82270)  Problem # 2:  UNSPECIFIED BREAST SCREENING (ICD-V76.10) self breast exams done at home mammogram scheduled Orders: Mammogram (Screening) (Mammo)  Complete Medication List: 1)  Adult Aspirin Ec Low Strength 81 Mg Tbec (Aspirin) .... Take one tablet once daily 2)  Nortriptyline Hcl 25 Mg Caps (Nortriptyline hcl) .... Take one capsule at bedtime 3)  Gabapentin 300 Mg Caps (Gabapentin) .... 2 capsules by mouth nightly 4)  Nexium 40 Mg Cpdr (Esomeprazole magnesium) .... Take one capsule daily for stomach 5)  Advair Diskus 250-50 Mcg/dose Misc (Fluticasone-salmeterol) .Marland Kitchen.. 1 puff twice daily for breathing 6)  Hydrochlorothiazide 25 Mg Tabs (Hydrochlorothiazide) .... Take one (1) by mouth daily for blood pressure 7)  Glucophage 1000 Mg Tabs (Metformin hcl) .Marland Kitchen.. 1 tablet by mouth two times a day for blood sugar 8)  Multivitamins Tabs (Multiple vitamin) .Marland Kitchen.. 1 tablet by mouth daily 9)  Glucometer Elite Classic Kit (Blood glucose monitoring suppl) .... Check blood sugar daily dx 250.00 dispense lancets and test strips, glucometer 10)  Januvia 100 Mg Tabs (Sitagliptin phosphate) .Marland Kitchen.. 1 tablet by mouth daily for  diabetes 11)  Lipitor 10 Mg Tabs (Atorvastatin calcium) .Marland Kitchen.. 1 tablet by mouth nightly for cholesterol 12)  Proventil Hfa 108 (90 Base) Mcg/act Aers (Albuterol sulfate) .... 2 puffs every 6 hours as needed for shortness of breath 13)  Diovan 160 Mg Tabs (Valsartan) .Marland Kitchen.. 1 tablet by mouth daily for blood pressure  Other Orders: EKG w/ Interpretation (93000) Capillary Blood Glucose/CBG (91478) UA Dipstick w/o Micro (manual) (29562) T-Urine Microalbumin w/creat. ratio [phone])  Diabetes Management Assessment/Plan:      The following lipid goals have been established for the patient: Total cholesterol goal of 200; LDL cholesterol goal of 100; HDL cholesterol goal of 40; Triglyceride goal of 150.  Her blood pressure goal is < 130/80.    Hypertension Assessment/Plan:      The patient's hypertensive risk group is category C: Target organ damage and/or diabetes.  Her calculated 10 year risk of coronary heart disease is 20 %.  Today's blood pressure is 168/101.  Her blood pressure goal is < 130/80.  Patient Instructions: 1)  Follow up in July 2011 for diabetes. 2)  Take your medications before this visit. 3)  Will need hgba1c, cbg, peak flow, o2 sat 4)  Your urine still has sugar in it today.  This will be rechecked on your next visit.  Remember you need to get your blood sugar and hbga1c down to keep your heart, kidneys and eyes from working too hard. 5)  Take your medications as ordered.  6)  Check your blood sugar  daily and bring log into this office on the next visit. 7)  Eye exam - you need a basic eye exam by an eye doctor since you have high blood pressure and diabetes.  Retasure testing is still available at [facility] but this test does not determine your need for glasses  Laboratory Results   Urine Tests  Date/Time Received: Dec 22, 2009 8:47 AM  Routine Urinalysis   Color: yellow Appearance: Clear Glucose: 250   (Normal Range: Negative) Bilirubin:  negative   (Normal Range: Negative) Ketone: negative   (Normal Range: Negative) Spec. Gravity: 1.025   (Normal Range: 1.003-1.035) Blood: negative   (Normal Range: Negative) pH: 5.5   (Normal Range: 5.0-8.0) Protein: 30   (Normal Range: Negative) Urobilinogen: 1.0   (Normal Range: 0-1) Nitrite: negative   (Normal Range: Negative) Leukocyte Esterace: negative   (Normal Range: Negative)     Blood Tests     CBG Random:: 249  Date/Time Received: Dec 22, 2009 9:40 AM   Wet Mount/KOH Source: vaginal WBC/hpf: 1-5 Bacteria/hpf: 1+ Clue cells/hpf: none Yeast/hpf: none Trichomonas/hpf: none  Stool - Occult Blood Hemmoccult #1: negative Date: 12/22/2009    Osteoporosis  Patient complains of: kyphosis No back pain No prior fracture No height loss No  Patient reports: Personal history of fracture No Hx of Fx in a 1st degree relative No Caucasian/Asian Race No Advanced Age No Female Gender Yes Dementia No Poor health/fragility No Current cigarette smoker No Low body weight (<127 lbs) No Estrogen deficiency Yes Low calcium intake (lifelong) No Alcoholism No Inadequate physical activity No Poor eyesight/risk of falls No   Prevention & Chronic Care Immunizations   Influenza vaccine: Fluvax 3+  (06/09/2008)   Influenza vaccine deferral: Not available  (12/22/2009)    Tetanus booster: 08/07/2002: historical per pt   Td booster deferral: Deferred  (12/22/2009)    Pneumococcal vaccine: Not documented   Pneumococcal vaccine deferral: Deferred  (12/22/2009)   Pneumococcal vaccine due: 04/17/2017  Colorectal Screening   Hemoccult: Not documented   Hemoccult action/deferral: Ordered  (12/22/2009)   Hemoccult due: 12/23/2010    Colonoscopy: normal per pt  (08/07/2005)   Colonoscopy due: 08/08/2015  Other Screening   Pap smear: Not documented   Pap smear action/deferral: Ordered  (12/22/2009)    Mammogram: ASSESSMENT: Negative - BI-RADS 1^MM DIGITAL SCREENING   (11/13/2008)   Mammogram action/deferral: Ordered  (12/22/2009)   Smoking status: never  (12/22/2009)  Diabetes Mellitus   HgbA1C: 9.2   (11/24/2009)   HgbA1C action/deferral: Ordered  (12/22/2009)   Hemoglobin A1C due: 02/23/2010    Eye exam: ? glaucoma  (11/16/2008)   Diabetic eye exam action/deferral: Ophthalmology referral  (12/22/2009)    Foot exam: yes  (12/22/2009)   High risk foot: Yes  (11/24/2009)   Foot care education: Done  (11/24/2009)   Foot exam due: 03/24/2010    Urine microalbumin/creatinine ratio: Not documented   Urine microalbumin action/deferral: Ordered   Urine microalbumin/cr due: 12/23/2010  Lipids   Total Cholesterol: 127  (11/24/2009)   LDL: 65  (11/24/2009)   LDL Direct: Not documented   HDL: 47  (11/24/2009)   Triglycerides: 73  (11/24/2009)   Lipid panel due: 11/25/2010    SGOT (AST): 14  (11/24/2009)   SGPT (ALT): 13  (11/24/2009)   Alkaline phosphatase: 93  (11/24/2009)   Total bilirubin: 1.1  (11/24/2009)   Liver panel due: 11/25/2010  Hypertension   Last Blood Pressure: 168 / 101  (12/22/2009)   Serum creatinine: 0.71  (11/24/2009)   Serum potassium 4.2  (11/24/2009)  Self-Management Support :    Diabetes self-management support: Not documented    Hypertension self-management support: Not documented    Lipid self-management support: Not documented     EKG  Procedure date:  12/22/2009  Findings:      NSR = 73  Laboratory Results   Urine Tests    Routine  Urinalysis   Color: yellow Appearance: Clear Glucose: 250   (Normal Range: Negative) Bilirubin: negative   (Normal Range: Negative) Ketone: negative   (Normal Range: Negative) Spec. Gravity: 1.025   (Normal Range: 1.003-1.035) Blood: negative   (Normal Range: Negative) pH: 5.5   (Normal Range: 5.0-8.0) Protein: 30   (Normal Range: Negative) Urobilinogen: 1.0   (Normal Range: 0-1) Nitrite: negative   (Normal Range: Negative) Leukocyte Esterace: negative    (Normal Range: Negative)     Blood Tests     CBG Random:: 249mg /dL    DIRECTV KOH: Negative

## 2010-09-06 NOTE — Assessment & Plan Note (Signed)
Summary: HTN/Diabetes   Vital Signs:  Patient profile:   60 year old female Weight:      225.5 pounds BSA:     2.09 Temp:     97.8 degrees F oral Pulse rate:   70 / minute Pulse rhythm:   regular Resp:     16 per minute BP sitting:   177 / 115  (left arm) Cuff size:   regular  Vitals Entered By: Levon Hedger (November 24, 2009 11:26 AM)  Serial Vital Signs/Assessments:  Time      Position  BP       Pulse  Resp  Temp     By 12:34 PM            152/100                        Lehman Prom FNP  CC: diabetes follow up, Hypertension Management, Lipid Management, Abdominal Pain, Depression Is Patient Diabetic? Yes Pain Assessment Patient in pain? no      CBG Result 175  Does patient need assistance? Functional Status Self care Ambulation Normal   CC:  diabetes follow up, Hypertension Management, Lipid Management, Abdominal Pain, and Depression.  History of Present Illness:  Pt into the office for follow up on diabetes. Pt has not been in this office in several months due to eligibility.  Obesity - gained over 25 pounds since her last visit here. She admits that she had not been very active over the winter months.   Comments:  additional stressors at this time regarding her 42 year old daughter who was recently dx with bipolar.  Diabetes Management History:      The patient is a 60 years old female who comes in for evaluation of DM Type 2.  She has not been enrolled in the "Diabetic Education Program".  She states understanding of dietary principles but she is not following the appropriate diet.  No sensory loss is reported.  Self foot exams are not being performed.  She is checking home blood sugars.  She says that she is exercising.  Type of exercise includes: walking.        Hypoglycemic symptoms are not occurring.  No hyperglycemic symptoms are reported.        Symptoms which suggest diabetic complications include paresthesias.  No changes have been made to her  treatment plan since last visit.    Dyspepsia History:      She has no alarm features of dyspepsia including no history of melena, hematochezia, dysphagia, persistent vomiting, or involuntary weight loss > 5%.  There is a prior history of GERD.  The patient does not have a prior history of documented ulcer disease.  The dominant symptom is not heartburn or acid reflux.  An H-2 blocker medication is not currently being taken.  She has no history of a positive H. Pylori serology.  No previous upper endoscopy has been done.    Hypertension History:      She denies headache, chest pain, and palpitations.  She notes no problems with any antihypertensive medication side effects.  Pt has taken her BP meds already today.        Positive major cardiovascular risk factors include female age 69 years old or older, diabetes, hyperlipidemia, and hypertension.  Negative major cardiovascular risk factors include non-tobacco-user status.        Further assessment for target organ damage reveals no history of ASHD, cardiac end-organ damage (CHF/LVH),  stroke/TIA, peripheral vascular disease, renal insufficiency, or hypertensive retinopathy.    Lipid Management History:      Positive NCEP/ATP III risk factors include female age 9 years old or older, diabetes, and hypertension.  Negative NCEP/ATP III risk factors include non-tobacco-user status, no ASHD (atherosclerotic heart disease), no prior stroke/TIA, no peripheral vascular disease, and no history of aortic aneurysm.        The patient states that she knows about the "Therapeutic Lifestyle Change" diet.  Her compliance with the TLC diet is good.  The patient does not know about adjunctive measures for cholesterol lowering.  She expresses no side effects from her lipid-lowering medication.  Comments include: pt is taking medications as ordered.  The patient denies any symptoms to suggest myopathy or liver disease.      Diabetic Foot Exam Foot Inspection Is there  a history of a foot ulcer?              No Is there a foot ulcer now?              No Can the patient see the bottom of their feet?          No Are the shoes appropriate in style and fit?          Yes Is there swelling or an abnormal foot shape?          No Are the toenails long?                Yes Are the toenails thick?                No Are the toenails ingrown?              No Is there heavy callous build-up?              No Is there pain in the calf muscle (Intermittent claudication) when walking?    NoIs there a claw toe deformity?              Yes Is there elevated skin temperature?            No Is there limited ankle dorsiflexion?            No Is there foot or ankle muscle weakness?            No  Diabetic Foot Care Education Patient educated on appropriate care of diabetic feet.  Pulse Check          Right Foot          Left Foot Dorsalis Pedis:        normal            normal  High Risk Feet? Yes   Habits & Providers  Alcohol-Tobacco-Diet     Tobacco Status: never  Exercise-Depression-Behavior     Does Patient Exercise: yes     Exercise Counseling: to improve exercise regimen     Type of exercise: walking     Drug Use: no     Seat Belt Use: 75     Sun Exposure: occasionally  Allergies: No Known Drug Allergies  Review of Systems General:  Denies loss of appetite. CV:  Denies fatigue. Resp:  Denies cough. GI:  Denies abdominal pain, nausea, and vomiting. Neuro:  Complains of tingling; bilateral feet.  Physical Exam  General:  alert.   Head:  normocephalic.   Lungs:  normal breath sounds.   Heart:  normal rate and  regular rhythm.   Msk:  up to the exam table Neurologic:  cane use Skin:  color normal.   Psych:  Oriented X3.   crying during the exam when talking about family stressors  Diabetes Management Exam:       Nails:          Left foot: too long          Right foot: too long   Impression & Recommendations:  Problem # 1:  DIABETES MELLITUS  (ICD-250.00) Blood sugar is uncontrolled advised pt that she needs to have better control over the blood sugar Her updated medication list for this problem includes:    Adult Aspirin Ec Low Strength 81 Mg Tbec (Aspirin) .Marland Kitchen... Take one tablet once daily    Glucophage 1000 Mg Tabs (Metformin hcl) .Marland Kitchen... 1 tablet by mouth two times a day for blood sugar    Januvia 100 Mg Tabs (Sitagliptin phosphate) .Marland Kitchen... 1 tablet by mouth daily for diabetes    Diovan 160 Mg Tabs (Valsartan) .Marland Kitchen... 1 tablet by mouth daily for blood pressure  Orders: Capillary Blood Glucose/CBG (82948) Hgb A1C (29562ZH) UA Dipstick w/o Micro (manual) (08657) T-Urine Microalbumin w/creat. ratio [phone]) T-Lipid Profile [phone]) T-Comprehensive Metabolic Panel [phone]) T-CBC w/Diff ([phone]) T-TSH [phone])  Problem # 2:  HYPERTENSION, BENIGN ESSENTIAL (ICD-401.1) Blood pressure is very elevated  advised pt that this may be due to weight gain may need additional meds Her updated medication list for this problem includes:    Hydrochlorothiazide 25 Mg Tabs (Hydrochlorothiazide) .Marland Kitchen... Take one (1) by mouth daily for blood pressure    Diovan 160 Mg Tabs (Valsartan) .Marland Kitchen... 1 tablet by mouth daily for blood pressure  Orders: Rapid HIV  (92370)  Problem # 3:  ASTHMA (ICD-493.90) stable Her updated medication list for this problem includes:    Advair Diskus 250-50 Mcg/dose Misc (Fluticasone-salmeterol) .Marland Kitchen... 1 puff twice daily for breathing    Proventil Hfa 108 (90 Base) Mcg/act Aers (Albuterol sulfate) .Marland Kitchen... 2 puffs every 6 hours as needed for shortness of breath  Problem # 4:  GERD (ICD-530.81) stable Her updated medication list for this problem includes:    Nexium 40 Mg Cpdr (Esomeprazole magnesium) .Marland Kitchen... Take one capsule daily for stomach  Complete Medication List: 1)  Adult Aspirin Ec Low Strength 81 Mg Tbec (Aspirin) .... Take one tablet once daily 2)  Nortriptyline Hcl 25 Mg Caps  (Nortriptyline hcl) .... Take one capsule at bedtime 3)  Gabapentin 300 Mg Caps (Gabapentin) .... 2 capsules by mouth nightly 4)  Nexium 40 Mg Cpdr (Esomeprazole magnesium) .... Take one capsule daily for stomach 5)  Advair Diskus 250-50 Mcg/dose Misc (Fluticasone-salmeterol) .Marland Kitchen.. 1 puff twice daily for breathing 6)  Hydrochlorothiazide 25 Mg Tabs (Hydrochlorothiazide) .... Take one (1) by mouth daily for blood pressure 7)  Glucophage 1000 Mg Tabs (Metformin hcl) .Marland Kitchen.. 1 tablet by mouth two times a day for blood sugar 8)  Multivitamins Tabs (Multiple vitamin) .Marland Kitchen.. 1 tablet by mouth daily 9)  Glucometer Elite Classic Kit (Blood glucose monitoring suppl) .... Check blood sugar daily dx 250.00 dispense lancets and test strips, glucometer 10)  Januvia 100 Mg Tabs (Sitagliptin phosphate) .Marland Kitchen.. 1 tablet by mouth daily for diabetes 11)  Lipitor 10 Mg Tabs (Atorvastatin calcium) .Marland Kitchen.. 1 tablet by mouth nightly for cholesterol 12)  Proventil Hfa 108 (90 Base) Mcg/act Aers (Albuterol sulfate) .... 2 puffs every 6 hours as needed for shortness of breath 13)  Diovan 160 Mg Tabs (Valsartan) .Marland Kitchen.. 1 tablet  by mouth daily for blood pressure  Diabetes Management Assessment/Plan:      The following lipid goals have been established for the patient: Total cholesterol goal of 200; LDL cholesterol goal of 100; HDL cholesterol goal of 40; Triglyceride goal of 150.  Her blood pressure goal is < 130/80.    Hypertension Assessment/Plan:      The patient's hypertensive risk group is category C: Target organ damage and/or diabetes.  Her calculated 10 year risk of coronary heart disease is 17 %.  Today's blood pressure is 177/115.  Her blood pressure goal is < 130/80.  Lipid Assessment/Plan:      Based on NCEP/ATP III, the patient's risk factor category is "history of diabetes".  The patient's lipid goals are as follows: Total cholesterol goal is 200; LDL cholesterol goal is 100; HDL cholesterol goal is 40; Triglyceride goal  is 150.     Patient Instructions: 1)  Your Hgba1c = 9.2. 2)  It was 6.7 last time. 3)  Diabetes is back to being uncontrolled. This is likely due to weight gain and diet changes.  you will need to get focused on your diabetes.  The next step will be insulin. 4)  Will give 3 months to improve Hgba1c and bring blood sugar levels down 5)  High Blood pressure - high today 6)  again weight is a contributor 7)  you will need to monitor sodium in your diet 8)  Schedule an appointment in 4 weeks for a complete physical exam. 9)  You will need mammogram, PAP, PHQ-9  Prescriptions: JANUVIA 100 MG TABS (SITAGLIPTIN PHOSPHATE) 1 tablet by mouth daily for diabetes  #100 x 0   Entered and Authorized by:   Lehman Prom FNP   Signed by:   Lehman Prom FNP on 11/24/2009   Method used:   Print then Give to Patient   RxID:   [id number]   Diabetic Foot Exam Foot Inspection Is there a history of a foot ulcer?              No Is there a foot ulcer now?              No Can the patient see the bottom of their feet?          No Are the shoes appropriate in style and fit?          Yes Is there swelling or an abnormal foot shape?          No Are the toenails long?                Yes Are the toenails thick?                No Are the toenails ingrown?              No Is there heavy callous build-up?              No Is there pain in the calf muscle (Intermittent claudication) when walking?    NoIs there a claw toe deformity?              Yes Is there elevated skin temperature?            No Is there limited ankle dorsiflexion?            No Is there foot or ankle muscle weakness?            No  Diabetic Foot Care Education Patient educated  on appropriate care of diabetic feet.  Pulse Check          Right Foot          Left Foot Dorsalis Pedis:        normal            normal  High Risk Feet? Yes  Laboratory Results   Urine Tests  Date/Time Received: November 24, 2009 11:49 AM   Routine  Urinalysis   Color: dk Devin Foskey Appearance: Clear Glucose: negative   (Normal Range: Negative) Bilirubin: negative   (Normal Range: Negative) Ketone: moderate (40)   (Normal Range: Negative) Spec. Gravity: 1.020   (Normal Range: 1.003-1.035) Blood: negative   (Normal Range: Negative) pH: 6.0   (Normal Range: 5.0-8.0) Protein: 30   (Normal Range: Negative) Urobilinogen: 0.2   (Normal Range: 0-1) Nitrite: negative   (Normal Range: Negative) Leukocyte Esterace: negative   (Normal Range: Negative)     Blood Tests   Date/Time Received: November 24, 2009 11:51 AM   HGBA1C: 9.2 %   (Normal Range: Non-Diabetic - 3-6%   Control Diabetic - 6-8%) CBG Random:: 175  Date/Time Received: November 24, 2009 2:52 PM   Other Tests  Rapid HIV: negative    Laboratory Results   Urine Tests    Routine Urinalysis   Color: dk Chantry Headen Appearance: Clear Glucose: negative   (Normal Range: Negative) Bilirubin: negative   (Normal Range: Negative) Ketone: moderate (40)   (Normal Range: Negative) Spec. Gravity: 1.020   (Normal Range: 1.003-1.035) Blood: negative   (Normal Range: Negative) pH: 6.0   (Normal Range: 5.0-8.0) Protein: 30   (Normal Range: Negative) Urobilinogen: 0.2   (Normal Range: 0-1) Nitrite: negative   (Normal Range: Negative) Leukocyte Esterace: negative   (Normal Range: Negative)     Blood Tests     HGBA1C: 9.2 %   (Normal Range: Non-Diabetic - 3-6%   Control Diabetic - 6-8%) CBG Random:: 175mg /dL    Other Tests  Rapid HIV: negative

## 2010-09-06 NOTE — Progress Notes (Signed)
Summary: Refill Diovan  Phone Note Outgoing Call   Summary of Call: Do you want her Diovan refilled?  She hasn't filled this since 09/21/09.  Last visit was 12/22/09. Initial call taken by: Dutch Quint RN,  March 24, 2010 4:48 PM  Follow-up for Phone Call        ok to refill Follow-up by: Lehman Prom FNP,  March 24, 2010 5:45 PM  Additional Follow-up for Phone Call Additional follow up Details #1::        Noted.  Dutch Quint RN  March 25, 2010 10:27 AM

## 2010-09-08 NOTE — Progress Notes (Signed)
Summary: Query:  Refill Metformin and HCTZ?  Phone Note Refill Request Call back at [phone]   Refills Requested: Medication #1:  HYDROCHLOROTHIAZIDE 25 MG TABS Take one (1) by mouth daily for blood pressure  Medication #2:  GLUCOPHAGE 1000 MG TABS 1 tablet by mouth two times a day for blood sugar NEED REFILL [facility] [place] PHONE [phone]  Initial call taken by: Domenic Polite,  August 05, 2010 9:44 AM  Follow-up for Phone Call        [facility] and pt. only filled Metformin on 05/28/09 -- not since.  Refill per protocol?  Last seen 05/11/10. Follow-up by: Dutch Quint RN,  August 05, 2010 2:44 PM  Additional Follow-up for Phone Call Additional follow up Details #1::        yes, ok to fill per protocal - i think pt has been getting from [facility] Additional Follow-up by: Lehman Prom FNP,  August 05, 2010 3:55 PM    Additional Follow-up for Phone Call Additional follow up Details #2::    pt would like rx's called into [facility].Hassell Halim CMA  August 05, 2010 4:22 PM   Noted.  Refills completed.  Dutch Quint RN  August 09, 2010 2:59 PM   Prescriptions: GLUCOPHAGE 1000 MG TABS (METFORMIN HCL) 1 tablet by mouth two times a day for blood sugar  #60 x 3   Entered by:   Dutch Quint RN   Authorized by:   Lehman Prom FNP   Signed by:   Dutch Quint RN on 08/09/2010   Method used:   Electronically to        [facility]  [phone]* (retail)       [address]       [place], [place]  [address]       Ph: [phone] or [phone]       Fax: [phone]   RxID:   [id number] HYDROCHLOROTHIAZIDE 25 MG TABS (HYDROCHLOROTHIAZIDE) Take one (1) by mouth daily for blood pressure  #30 x 3   Entered by:   Dutch Quint RN   Authorized by:   Lehman Prom FNP   Signed by:   Dutch Quint RN on 08/09/2010   Method used:   Electronically to        [facility]  [phone]* (retail)  [address]       [place], [place]  [address]       Ph: [phone] or [phone]       Fax: [phone]   RxID:   [id number]

## 2010-09-22 ENCOUNTER — Telehealth (INDEPENDENT_AMBULATORY_CARE_PROVIDER_SITE_OTHER): Payer: Self-pay | Admitting: Nurse Practitioner

## 2010-09-28 NOTE — Progress Notes (Signed)
Summary: Query:  Refill Lipitor?  Phone Note Outgoing Call   Summary of Call: Received request for Lipitor refills from [facility] -- pt. has been getting meds from [facility] [address].  Pt. called to find out pharmacy choice -- Left message on answering machine for pt. to return call.    Query:  refill Lipitor per protocol?  Pt. was scheduled to have appt. 09/12/10 with provider, cancelled, has not rescheduled.    Initial call taken by: Dutch Quint RN,  September 22, 2010 4:07 PM  Follow-up for Phone Call        yes, ok to refill Follow-up by: Lehman Prom FNP,  September 22, 2010 4:19 PM  Additional Follow-up for Phone Call Additional follow up Details #1::        Noted.  Refill completed and faxed to [facility] per pt. request.  Dutch Quint RN  September 23, 2010 2:32 PM     Prescriptions: LIPITOR 10 MG TABS (ATORVASTATIN CALCIUM) 1 tablet by mouth nightly for cholesterol  #90 x 0   Entered by:   Dutch Quint RN   Authorized by:   Lehman Prom FNP   Signed by:   Dutch Quint RN on 09/23/2010   Method used:   Historical   RxID:   [id number]

## 2010-10-27 ENCOUNTER — Telehealth (INDEPENDENT_AMBULATORY_CARE_PROVIDER_SITE_OTHER): Payer: Self-pay | Admitting: Nurse Practitioner

## 2010-11-01 ENCOUNTER — Encounter (INDEPENDENT_AMBULATORY_CARE_PROVIDER_SITE_OTHER): Payer: Self-pay | Admitting: Nurse Practitioner

## 2010-11-08 NOTE — Letter (Signed)
Summary: Generic Letter  [facility]  [address] [place], [place] [address]   Phone: [phone]  Fax: [phone]        11/01/2010  Alicia Sampson [address] [place], [place]  [address]  Dear Ms. PAOLO,  We have been unable to contact you by telephone.  Please call our office, at your earliest convenience, so that we may speak with you.  Sincerely,   Dutch Quint RN

## 2010-11-08 NOTE — Progress Notes (Signed)
Summary: Query:  Refill Diovan?  Phone Note Outgoing Call   Summary of Call: Refill Diovan per protocol?  Pt. was scheduled to have appt. 09/12/10 with provider, cancelled, has not rescheduled.  Last seen 05/2010.  Initial call taken by: Dutch Quint RN,  October 27, 2010 4:38 PM  Follow-up for Phone Call        yes, ok to refill. call pt and advise her to reschedule Follow-up by: Lehman Prom FNP,  October 27, 2010 4:51 PM  Additional Follow-up for Phone Call Additional follow up Details #1::        Noted.  Refill completed.  Unable to leave message -- phone connection fast busy signal.  Dutch Quint RN  October 28, 2010 12:44 PM  Unable to leave message -- phone connection fast busy signal.  Dutch Quint RN  October 28, 2010 3:40  PM    Additional Follow-up for Phone Call Additional follow up Details #2::    unable to leave message busy signal, called [phone] unableto leave massage. Gaylyn Cheers RN  October 31, 2010 10:39 AM  Unable to leave message busy signal, called [phone] unable to leave massage, "customer unavailable".  Letter sent.  Dutch Quint RN  November 01, 2010 12:32 PM   New/Updated Medications: DIOVAN 320 MG TABS (VALSARTAN) One tablet by mouth daily for blood pressure Prescriptions: DIOVAN 320 MG TABS (VALSARTAN) One tablet by mouth daily for blood pressure  **Note change in dose**  #90 x 0   Entered by:   Dutch Quint RN   Authorized by:   Lehman Prom FNP   Signed by:   Dutch Quint RN on 10/28/2010   Method used:   Historical   RxID:   [id number]

## 2012-05-31 ENCOUNTER — Ambulatory Visit (HOSPITAL_BASED_OUTPATIENT_CLINIC_OR_DEPARTMENT_OTHER)
Admission: RE | Admit: 2012-05-31 | Discharge: 2012-05-31 | Disposition: A | Discharge status: Home / Self Care | Payer: Medicare Other | Source: Ambulatory Visit | Attending: Family Medicine | Admitting: Family Medicine

## 2012-05-31 ENCOUNTER — Other Ambulatory Visit (HOSPITAL_BASED_OUTPATIENT_CLINIC_OR_DEPARTMENT_OTHER): Payer: Self-pay | Admitting: Family Medicine

## 2012-05-31 DIAGNOSIS — Z1231 Encounter for screening mammogram for malignant neoplasm of breast: Secondary | ICD-10-CM

## 2012-08-21 ENCOUNTER — Encounter: Payer: Self-pay | Admitting: Family Medicine

## 2012-10-03 ENCOUNTER — Encounter: Payer: Self-pay | Admitting: Family Medicine

## 2012-10-03 DIAGNOSIS — E13319 Other specified diabetes mellitus with unspecified diabetic retinopathy without macular edema: Secondary | ICD-10-CM | POA: Insufficient documentation

## 2012-10-03 DIAGNOSIS — E785 Hyperlipidemia, unspecified: Secondary | ICD-10-CM | POA: Insufficient documentation

## 2012-10-31 ENCOUNTER — Other Ambulatory Visit: Payer: Self-pay | Admitting: *Deleted

## 2012-10-31 DIAGNOSIS — E785 Hyperlipidemia, unspecified: Secondary | ICD-10-CM

## 2012-10-31 DIAGNOSIS — IMO0001 Reserved for inherently not codable concepts without codable children: Secondary | ICD-10-CM

## 2012-10-31 DIAGNOSIS — E559 Vitamin D deficiency, unspecified: Secondary | ICD-10-CM

## 2012-11-01 ENCOUNTER — Other Ambulatory Visit: Payer: Medicare Other

## 2012-11-06 ENCOUNTER — Ambulatory Visit: Payer: Medicare Other | Admitting: Family Medicine

## 2012-11-18 ENCOUNTER — Ambulatory Visit: Payer: Medicare Other | Admitting: Family Medicine

## 2012-11-20 ENCOUNTER — Encounter: Payer: Self-pay | Admitting: Family Medicine

## 2012-11-20 ENCOUNTER — Ambulatory Visit (INDEPENDENT_AMBULATORY_CARE_PROVIDER_SITE_OTHER): Payer: Medicare Other | Admitting: Family Medicine

## 2012-11-20 VITALS — BP 177/94 | HR 86 | Wt 235.0 lb

## 2012-11-20 DIAGNOSIS — I10 Essential (primary) hypertension: Secondary | ICD-10-CM

## 2012-11-20 DIAGNOSIS — IMO0001 Reserved for inherently not codable concepts without codable children: Secondary | ICD-10-CM

## 2012-11-20 MED ORDER — PIOGLITAZONE HCL 30 MG PO TABS: 30.0000 mg | ORAL_TABLET | Freq: Every day | ORAL | Status: DC

## 2012-11-20 MED ORDER — LOSARTAN POTASSIUM-HCTZ 100-12.5 MG PO TABS: 1.0000 | ORAL_TABLET | Freq: Two times a day (BID) | ORAL | Status: DC

## 2012-11-20 MED ORDER — METFORMIN HCL 1000 MG PO TABS: 1000.0000 mg | ORAL_TABLET | Freq: Two times a day (BID) | ORAL | Status: DC

## 2012-11-20 MED ORDER — AMLODIPINE BESYLATE 5 MG PO TABS
5.0000 mg | ORAL_TABLET | Freq: Every day | ORAL | Status: DC
Start: 2012-11-20 — End: 2013-06-20

## 2012-11-20 NOTE — Progress Notes (Signed)
Subjective:     Patient ID: Alicia Sampson, female   DOB: [birth year], 62 y.o.   MRN: [id number]  HPI Alicia Sampson is here today to discuss the conditions listed below.  1)  Hypertension:  Her blood pressure is controlled by Losartan/HCTZ and Amlodipine.  She needs both medications refilled.    2)  Type II DM: She is taking a combination of Januvia, Metformin and Actos and has done well on it.  She continues monitoring her sugars daily which are running around 120 mg/dL.   Review of Systems  Constitutional: Negative for activity change, appetite change, fatigue and unexpected weight change.  Respiratory: Negative for chest tightness and shortness of breath.   Cardiovascular: Negative for chest pain, palpitations and leg swelling.  Gastrointestinal: Negative for abdominal pain.  Musculoskeletal: Negative.   Skin: Negative.   Neurological: Positive for numbness.  Psychiatric/Behavioral: Negative.    Past Medical History  Diagnosis Date  . DM type 2 (diabetes mellitus, type 2)   . Retinopathy due to secondary diabetes mellitus   . Neuropathy   . Hypertension   . Hyperlipidemia   . Blindness of left eye     Previous eye doctor thought she may have had some type of CVA that affected her vision.   Family History  Problem Relation Age of Onset  . Multiple myeloma Mother   . Diabetes type II Father   . Hypertension Father   . Stomach cancer Father   . CVA Other    History   Social History Narrative   Marital Status: Divorced    Children:  Son (1)  Daughter (1)    Pets:  Dog Marine scientist) Mix (Child psychotherapist)    Living Situation: Lives with her sister.   Occupation: Disabled (Neuropathy)    Education: Retail buyer & Counseling)    Tobacco:  She has never smoked.     Drug Use:  None   Diet:  Regular   Exercise:  Treadmill   Hobbies: Puzzles/ Coin Collector/ Reading                Objective:   Physical Exam  Constitutional: She appears  well-nourished. No distress.  HENT:  Head: Normocephalic.  Eyes: Conjunctivae are normal. No scleral icterus.  Neck: Neck supple. No thyromegaly present.  Cardiovascular: Normal rate, regular rhythm and normal heart sounds.   Pulmonary/Chest: Effort normal and breath sounds normal.  Abdominal: There is no tenderness.  Musculoskeletal: She exhibits no edema and no tenderness.  Lymphadenopathy:    She has no cervical adenopathy.  Neurological: She is alert.  Skin: Skin is warm and dry.  Psychiatric: She has a normal mood and affect. Her behavior is normal. Judgment and thought content normal.       Assessment:     Hypertension Type II DM    Plan:     Presciptions were given for Actos, metformin, amlodipine and Hyzaar.

## 2012-11-24 ENCOUNTER — Encounter: Payer: Self-pay | Admitting: Family Medicine

## 2012-11-24 NOTE — Patient Instructions (Addendum)

## 2012-11-26 ENCOUNTER — Encounter: Payer: Self-pay | Admitting: Family Medicine

## 2013-06-20 ENCOUNTER — Encounter: Payer: Self-pay | Admitting: Family Medicine

## 2013-06-20 ENCOUNTER — Encounter (INDEPENDENT_AMBULATORY_CARE_PROVIDER_SITE_OTHER): Payer: Self-pay

## 2013-06-20 ENCOUNTER — Ambulatory Visit (INDEPENDENT_AMBULATORY_CARE_PROVIDER_SITE_OTHER): Payer: Medicare Other | Admitting: Family Medicine

## 2013-06-20 VITALS — BP 186/91 | HR 76 | Wt 232.0 lb

## 2013-06-20 DIAGNOSIS — IMO0002 Reserved for concepts with insufficient information to code with codable children: Secondary | ICD-10-CM

## 2013-06-20 DIAGNOSIS — F329 Major depressive disorder, single episode, unspecified: Secondary | ICD-10-CM

## 2013-06-20 DIAGNOSIS — E559 Vitamin D deficiency, unspecified: Secondary | ICD-10-CM

## 2013-06-20 DIAGNOSIS — E785 Hyperlipidemia, unspecified: Secondary | ICD-10-CM

## 2013-06-20 DIAGNOSIS — I10 Essential (primary) hypertension: Secondary | ICD-10-CM

## 2013-06-20 DIAGNOSIS — E1142 Type 2 diabetes mellitus with diabetic polyneuropathy: Secondary | ICD-10-CM

## 2013-06-20 DIAGNOSIS — Z23 Encounter for immunization: Secondary | ICD-10-CM

## 2013-06-20 DIAGNOSIS — R110 Nausea: Secondary | ICD-10-CM

## 2013-06-20 DIAGNOSIS — E1149 Type 2 diabetes mellitus with other diabetic neurological complication: Secondary | ICD-10-CM

## 2013-06-20 DIAGNOSIS — F3289 Other specified depressive episodes: Secondary | ICD-10-CM

## 2013-06-20 DIAGNOSIS — E1140 Type 2 diabetes mellitus with diabetic neuropathy, unspecified: Secondary | ICD-10-CM

## 2013-06-20 LAB — POCT GLYCOSYLATED HEMOGLOBIN (HGB A1C): Hemoglobin A1C: 12.1

## 2013-06-20 MED ORDER — AMLODIPINE BESYLATE 5 MG PO TABS: 5.0000 mg | ORAL_TABLET | Freq: Every day | ORAL | Status: DC

## 2013-06-20 MED ORDER — LIRAGLUTIDE 18 MG/3ML ~~LOC~~ SOPN: PEN_INJECTOR | SUBCUTANEOUS | Status: DC

## 2013-06-20 MED ORDER — VITAMIN D (ERGOCALCIFEROL) 1.25 MG (50000 UNIT) PO CAPS: ORAL_CAPSULE | ORAL | Status: AC

## 2013-06-20 MED ORDER — PROMETHAZINE HCL 12.5 MG PO TABS: ORAL_TABLET | ORAL | Status: DC

## 2013-06-20 MED ORDER — PIOGLITAZONE HCL 30 MG PO TABS: 30.0000 mg | ORAL_TABLET | Freq: Every day | ORAL | Status: DC

## 2013-06-20 MED ORDER — METFORMIN HCL 1000 MG PO TABS: 1000.0000 mg | ORAL_TABLET | Freq: Two times a day (BID) | ORAL | Status: DC

## 2013-06-20 MED ORDER — INSULIN PEN NEEDLE 32G X 6 MM MISC: Status: DC

## 2013-06-20 MED ORDER — DULOXETINE HCL 30 MG PO CPEP: 30.0000 mg | ORAL_CAPSULE | Freq: Every day | ORAL | Status: DC

## 2013-06-20 MED ORDER — ATORVASTATIN CALCIUM 10 MG PO TABS: 10.0000 mg | ORAL_TABLET | Freq: Every day | ORAL | Status: DC

## 2013-06-20 MED ORDER — GABAPENTIN 300 MG PO CAPS: 300.0000 mg | ORAL_CAPSULE | Freq: Two times a day (BID) | ORAL | Status: AC

## 2013-06-20 MED ORDER — LOSARTAN POTASSIUM-HCTZ 100-12.5 MG PO TABS: 1.0000 | ORAL_TABLET | Freq: Two times a day (BID) | ORAL | Status: DC

## 2013-06-20 NOTE — Progress Notes (Signed)
Subjective:    Patient ID: Alicia Sampson, female    DOB: [birth year], 62 y.o.   MRN: [id number]  HPI  Alicia Sampson is here today to discuss the conditions listed below:   1)  Hypertension - She has not been taking her medications on a regular basis.  She has been off of her medications for at least two months. She needs a refill on her losartan/HCTZ (100-12.5 mg) and Amlodipine (5 mg).    2)  Type II DM - She has run out of all her diabetic medications.  She needs her A1c rechecked today.    3)  Depression - She has not been taking her Cymbalta for about two months.  She did well with her Cymbalta and would like to get back on it.      Review of Systems  Constitutional: Negative.   HENT: Negative.   Eyes: Negative.   Respiratory: Negative.   Cardiovascular: Negative.   Gastrointestinal: Negative.   Endocrine: Negative.   Genitourinary: Negative.   Musculoskeletal: Negative.   Skin: Negative.   Allergic/Immunologic: Negative.   Neurological: Negative.   Hematological: Negative.   Psychiatric/Behavioral:       Depression     Past Medical History  Diagnosis Date  . DM type 2 (diabetes mellitus, type 2)   . Retinopathy due to secondary diabetes mellitus   . Neuropathy   . Hypertension   . Hyperlipidemia   . Blindness of left eye     Previous eye doctor thought she may have had some type of CVA that affected her vision.    Family History  Problem Relation Age of Onset  . Multiple myeloma Mother   . Diabetes type II Father   . Hypertension Father   . Stomach cancer Father   . CVA Other     History   Social History Narrative   Marital Status: Divorced    Children:  Son (1)  Daughter (1)    Pets:  Dog Marine scientist) Mix (Child psychotherapist)    Living Situation: Lives with her sister.   Occupation: Disabled (Neuropathy)    Education: Retail buyer & Counseling)    Tobacco:  She has never smoked.     Drug Use:  None   Diet:  Regular   Exercise:  Treadmill   Hobbies: Puzzles/ Coin Collector/ Reading                Objective:   Physical Exam  Nursing note and vitals reviewed. Constitutional: She is oriented to person, place, and time. She appears well-developed and well-nourished. She does not appear ill.  HENT:  Head: Normocephalic and atraumatic. Hair is normal.  Mouth/Throat: Oropharynx is clear and moist.  Eyes: Conjunctivae are normal. No scleral icterus.  Neck: Neck supple. No JVD present. No thyromegaly present.  Cardiovascular: Normal rate, regular rhythm and normal heart sounds.  Exam reveals no gallop and no friction rub.   No murmur heard. Pulmonary/Chest: Effort normal and breath sounds normal. No respiratory distress. She has no wheezes. She exhibits no tenderness.  Abdominal: Soft. She exhibits no distension and no mass. There is no tenderness.  Musculoskeletal: Normal range of motion. She exhibits no edema and no tenderness.  Lymphadenopathy:    She has no cervical adenopathy.  Neurological: She is alert and oriented to person, place, and time.  Skin: Skin is warm and dry. No rash noted.  Psychiatric: She has a normal mood and affect. Her behavior is normal. Judgment and thought content  normal.     Assessment & Plan:    Alicia Sampson was seen today for diabetes and hypertension.  Diagnoses and associated orders for this visit:  We had a long discussion about her health and about the fact that she needs to stay on her medications.    Essential hypertension, benign - losartan-hydrochlorothiazide (HYZAAR) 100-12.5 MG per tablet; Take 1 tablet by mouth 2 (two) times daily. - amLODipine (NORVASC) 5 MG tablet; Take 1 tablet (5 mg total) by mouth daily.  Other and unspecified hyperlipidemia - atorvastatin (LIPITOR) 10 MG tablet; Take 1 tablet (10 mg total) by mouth at bedtime.  Depressive disorder, not elsewhere classified - DULoxetine (CYMBALTA) 30 MG capsule; Take 1 capsule (30 mg total) by mouth  daily.  Type 2 diabetes, uncontrolled, with neuropathy - POCT glycosylated hemoglobin (Hb A1C) 12.1  - pioglitazone (ACTOS) 30 MG tablet; Take 1 tablet (30 mg total) by mouth at bedtime. Nightly - metFORMIN (GLUCOPHAGE) 1000 MG tablet; Take 1 tablet (1,000 mg total) by mouth 2 (two) times daily with a meal. - DULoxetine (CYMBALTA) 30 MG capsule; Take 1 capsule (30 mg total) by mouth daily. - gabapentin (NEURONTIN) 300 MG capsule; Take 1 capsule (300 mg total) by mouth 2 (two) times daily. Take 1 to 2 pills by mouth QHS - Liraglutide 18 MG/3ML SOPN; Start with .6 mg at night and increase to 1.8 mg as tolerated. - Insulin Pen Needle (NOVOFINE) 32G X 6 MM MISC; Use one pen needle daily  Unspecified vitamin D deficiency - Vitamin D, Ergocalciferol, (DRISDOL) 50000 UNITS CAPS capsule; Take 1 cap by PO twice a week  Nausea alone - promethazine (PHENERGAN) 12.5 MG tablet; Take 1/2 - 1 po q 4-6 hours for nausea  Need for prophylactic vaccination and inoculation against influenza - Flu Vaccine QUAD 36+ mos PF IM (Fluarix)   TIME SPENT "FACE TO FACE" WITH PATIENT -  60 MINS

## 2013-06-20 NOTE — Patient Instructions (Signed)
1)  Blood Sugar - Get back on the metformin and Actos.  In a week or so after you are on your oral meds then start on the Victoza.  .6 mg up to 1.8 mg.  F/U in 3 months for a recheck of your labs.    2)   BP - Get back on meds.    Diabetes and Standards of Medical Care  Diabetes is complicated. You may find that your diabetes team includes a dietitian, nurse, diabetes educator, eye doctor, and more. To help everyone know what is going on and to help you get the care you deserve, the following schedule of care was developed to help keep you on track. Below are the tests, exams, vaccines, medicines, education, and plans you will need. HbA1c test This test shows how well you have controlled your glucose over the past 2 3 months. It is used to see if your diabetes management plan needs to be adjusted.   It is performed at least 2 times a year if you are meeting treatment goals.  It is performed 4 times a year if therapy has changed or if you are not meeting treatment goals. Blood pressure test  This test is performed at every routine medical visit. The goal is less than 140/90 mmHg for most people, but 130/80 mmHg in some cases. Ask your health care provider about your goal. Dental exam  Follow up with the dentist regularly. Eye exam  If you are diagnosed with type 1 diabetes as a child, get an exam upon reaching the age of 10 years or older and have had diabetes for 3 5 years. Yearly eye exams are recommended after that initial eye exam.  If you are diagnosed with type 1 diabetes as an adult, get an exam within 5 years of diagnosis and then yearly.  If you are diagnosed with type 2 diabetes, get an exam as soon as possible after the diagnosis and then yearly. Foot care exam  Visual foot exams are performed at every routine medical visit. The exams check for cuts, injuries, or other problems with the feet.  A comprehensive foot exam should be done yearly. This includes visual inspection as  well as assessing foot pulses and testing for loss of sensation.  Check your feet nightly for cuts, injuries, or other problems with your feet. Tell your health care provider if anything is not healing. Kidney function test (urine microalbumin)  This test is performed once a year.  Type 1 diabetes: The first test is performed 5 years after diagnosis.  Type 2 diabetes: The first test is performed at the time of diagnosis.  A serum creatinine and estimated glomerular filtration rate (eGFR) test is done once a year to assess the level of chronic kidney disease (CKD), if present. Lipid profile (cholesterol, HDL, LDL, triglycerides)  Performed every 5 years for most people.  The goal for LDL is less than 100 mg/dL. If you are at high risk, the goal is less than 70 mg/dL.  The goal for HDL is 40 mg/dL 50 mg/dL for men and 50 mg/dL 60 mg/dL for women. An HDL cholesterol of 60 mg/dL or higher gives some protection against heart disease.  The goal for triglycerides is less than 150 mg/dL. Influenza vaccine, pneumococcal vaccine, and hepatitis B vaccine  The influenza vaccine is recommended yearly.  The pneumococcal vaccine is generally given once in a lifetime. However, there are some instances when another vaccination is recommended. Check with your health  care provider.  The hepatitis B vaccine is also recommended for adults with diabetes. Diabetes self-management education  Education is recommended at diagnosis and ongoing as needed. Treatment plan  Your treatment plan is reviewed at every medical visit. Document Released: 05/21/2009 Document Revised: 03/26/2013 Document Reviewed: 12/24/2012 [facility] Patient Information 2014 [place], [place].

## 2013-06-23 ENCOUNTER — Encounter: Payer: Self-pay | Admitting: *Deleted

## 2013-08-12 IMAGING — MG MM DIGITAL SCREENING BILAT W/ CAD
4 series · 4 of 4 positions shown · non-contrast
Comparison: 11/13/2008 from [REDACTED]

CLINICAL DATA: Screening.

DIGITAL BILATERAL SCREENING MAMMOGRAM WITH CAD

[R CC]
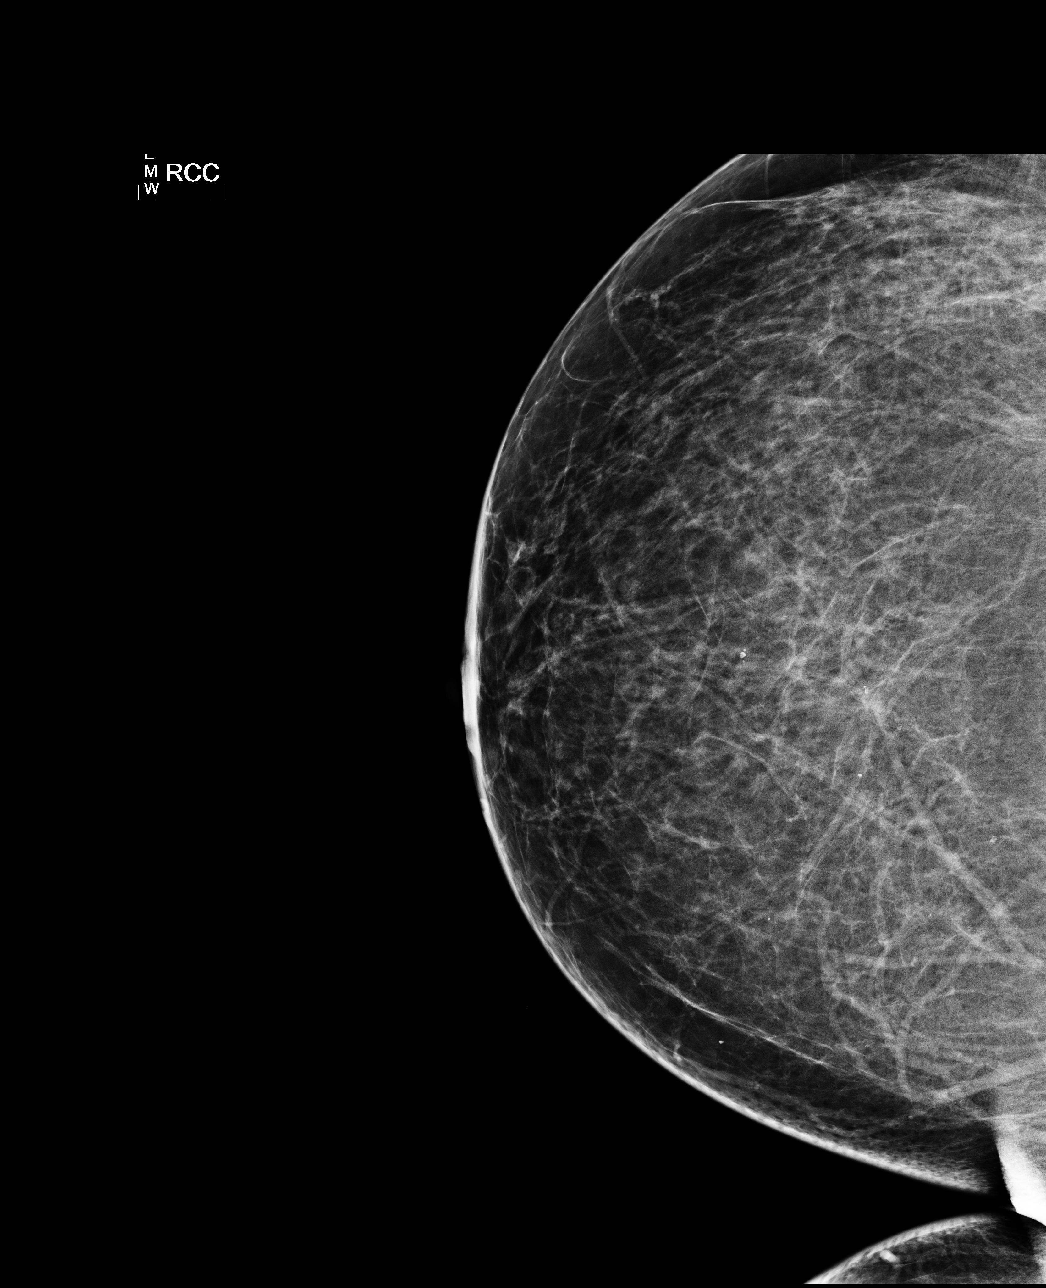

[L CC]
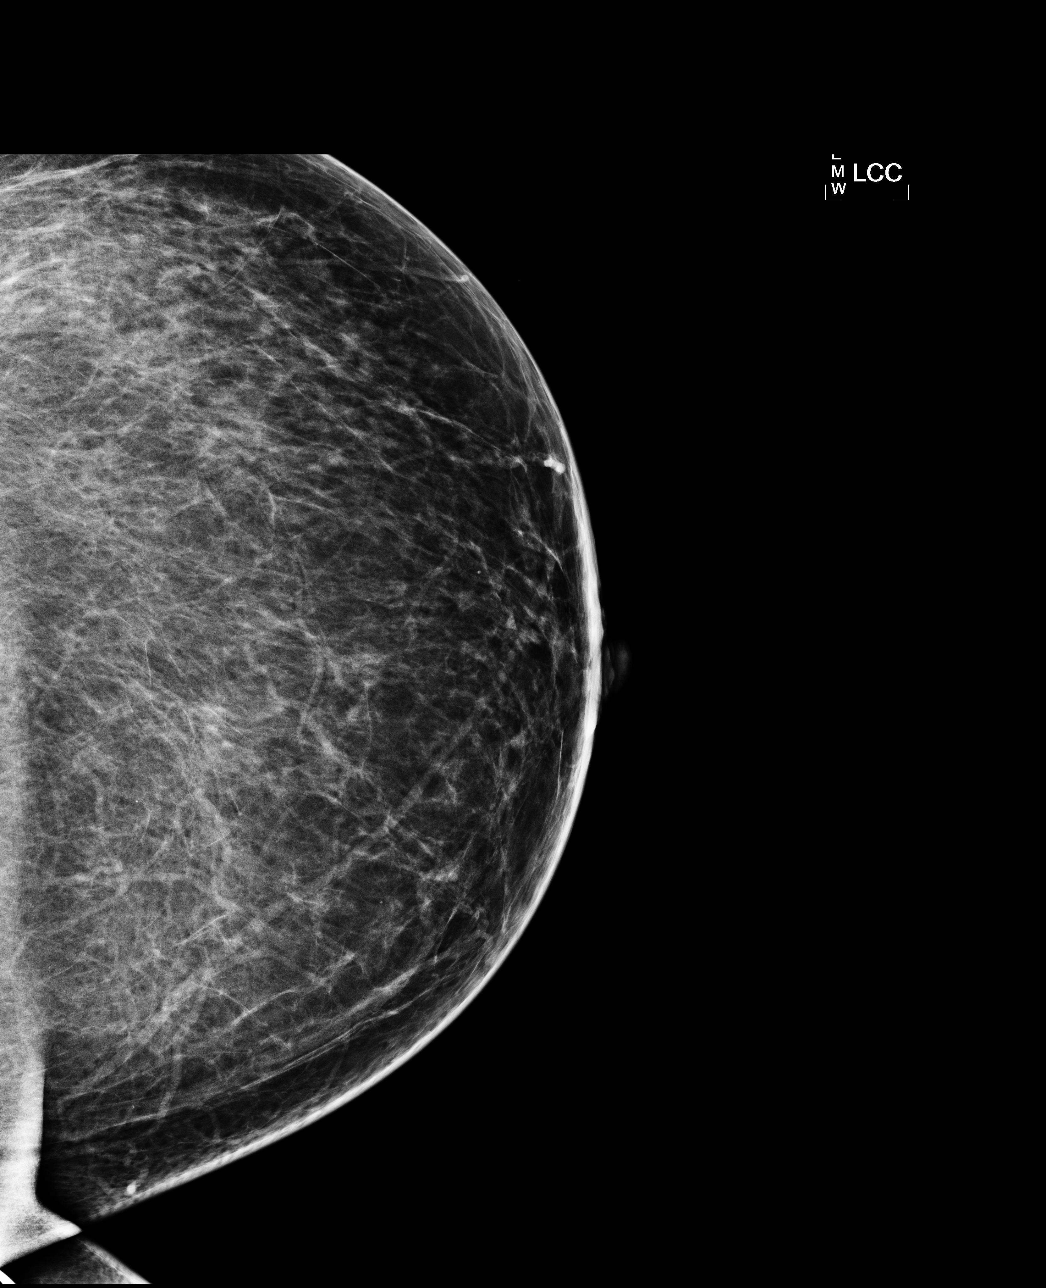

[L MLO]
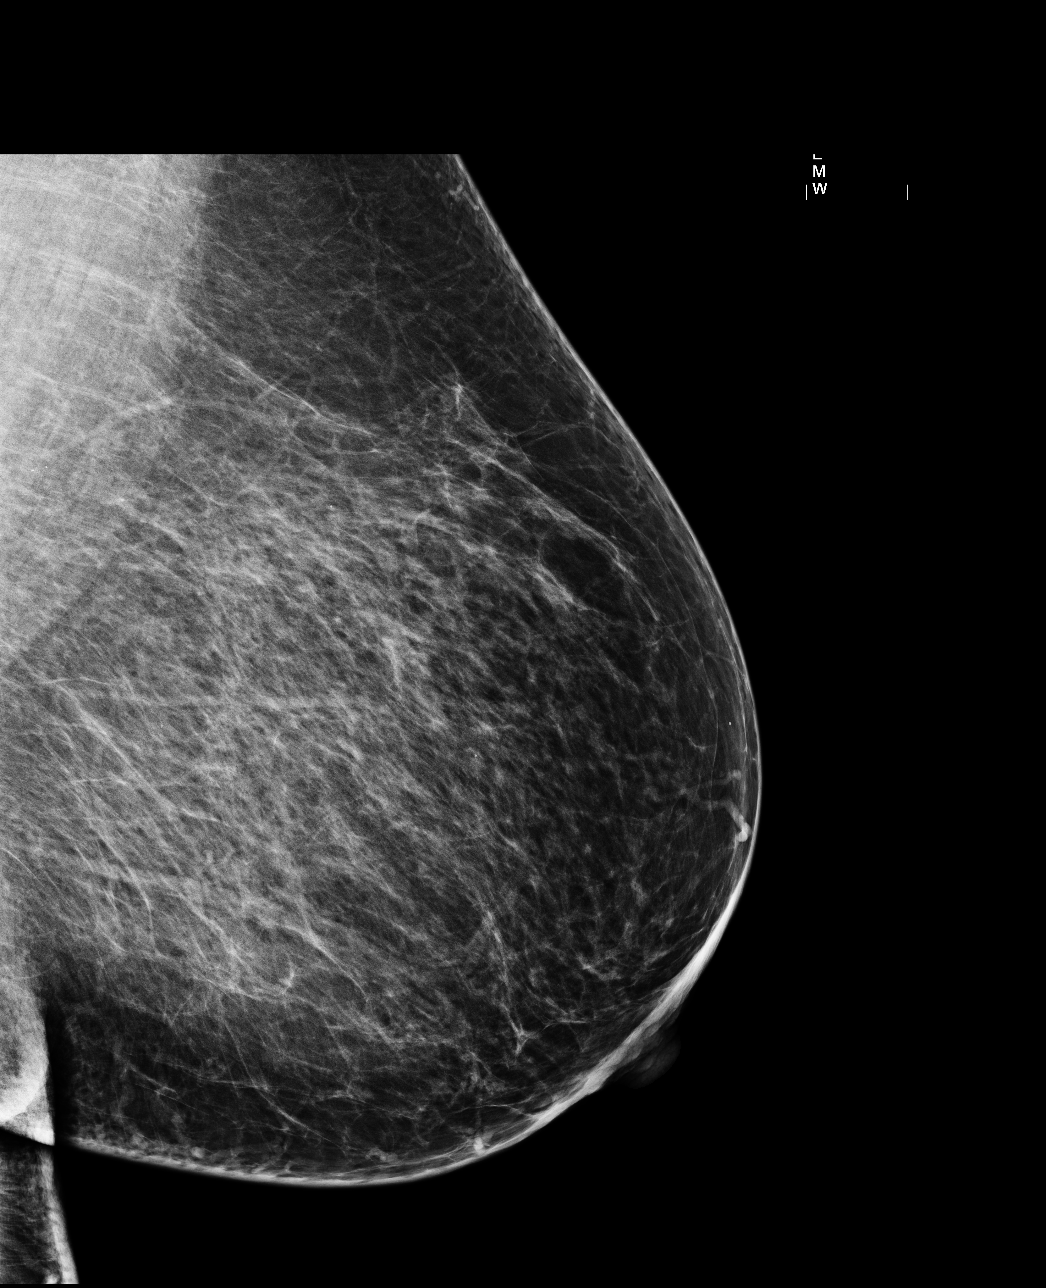

[R MLO]
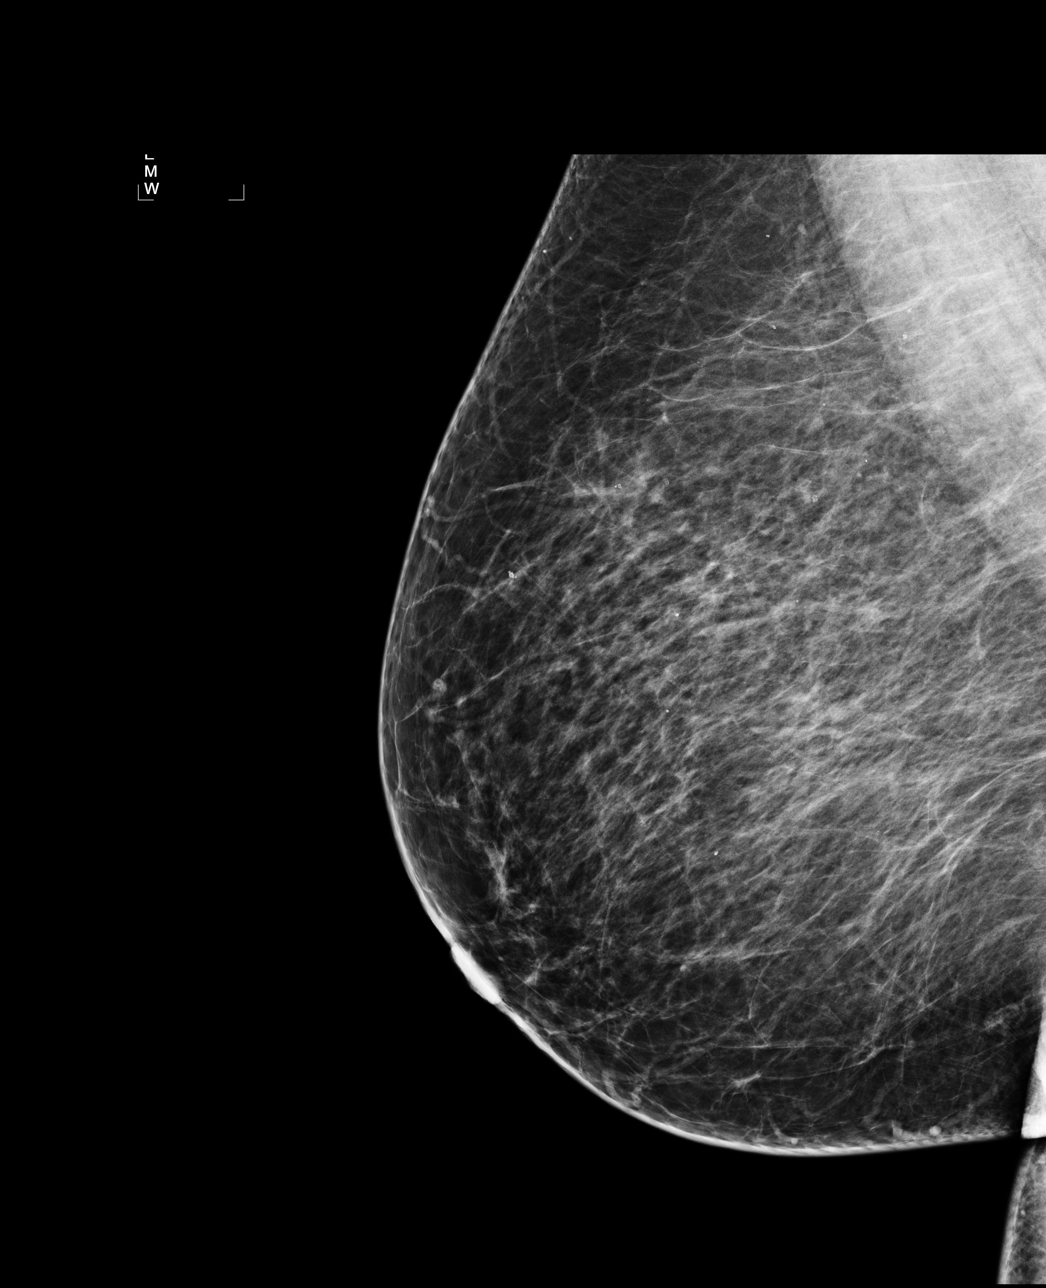

[4 of 4 positions shown; findings below may reference images not displayed]

FINDINGS: There are scattered fibroglandular densities.  There is
no suspicious dominant mass, architectural distortion or
calcification to suggest malignancy.

Images were processed with CAD.
IMPRESSION: No mammographic evidence of malignancy.

A result letter of this screening mammogram will be mailed directly
to the patient.

RECOMMENDATION:
Screening mammogram in one year. (Code:V6-N-ATG)

BI-RADS CATEGORY 1:  Negative

## 2013-09-13 NOTE — Addendum Note (Signed)
Addended by: Birdena JubileeZANARD, ROBYN on: 09/13/2013 06:39 PM   Modules accepted: Level of Service

## 2013-09-22 ENCOUNTER — Other Ambulatory Visit: Payer: Medicare Other

## 2013-09-29 ENCOUNTER — Ambulatory Visit: Payer: Medicare Other | Admitting: Family Medicine

## 2013-11-14 ENCOUNTER — Other Ambulatory Visit: Payer: Self-pay | Admitting: *Deleted

## 2013-11-17 ENCOUNTER — Other Ambulatory Visit: Payer: Medicare Other

## 2013-11-17 LAB — LIPID PANEL
Cholesterol: 175 mg/dL (ref 0–200)
HDL: 52 mg/dL (ref >39)
LDL Cholesterol: 108 mg/dL — ABNORMAL HIGH (ref 0–99)
Total CHOL/HDL Ratio: 3.4 Ratio
Triglycerides: 74 mg/dL (ref <150)
VLDL: 15 mg/dL (ref 0–40)

## 2013-11-17 LAB — COMPLETE METABOLIC PANEL WITH GFR
ALT: 8 U/L (ref 0–35)
AST: 13 U/L (ref 0–37)
Albumin: 3.6 g/dL (ref 3.5–5.2)
Alkaline Phosphatase: 84 U/L (ref 39–117)
BUN: 23 mg/dL (ref 6–23)
CO2: 27 mEq/L (ref 19–32)
Calcium: 9.1 mg/dL (ref 8.4–10.5)
Chloride: 101 mEq/L (ref 96–112)
Creat: 1.16 mg/dL — ABNORMAL HIGH (ref 0.50–1.10)
GFR, Est African American: 59 mL/min — ABNORMAL LOW
GFR, Est Non African American: 51 mL/min — ABNORMAL LOW
Glucose, Bld: 243 mg/dL — ABNORMAL HIGH (ref 70–99)
Potassium: 4.6 mEq/L (ref 3.5–5.3)
Sodium: 137 mEq/L (ref 135–145)
Total Bilirubin: 0.8 mg/dL (ref 0.2–1.2)
Total Protein: 6.4 g/dL (ref 6.0–8.3)

## 2013-11-17 LAB — HEMOGLOBIN A1C
Hgb A1c MFr Bld: 11.6 % — ABNORMAL HIGH (ref <5.7)
Mean Plasma Glucose: 286 mg/dL — ABNORMAL HIGH (ref <117)

## 2013-11-18 LAB — MICROALBUMIN / CREATININE URINE RATIO
Creatinine, Urine: 200.1 mg/dL
Microalb Creat Ratio: 2402.7 mg/g — ABNORMAL HIGH (ref 0.0–30.0)
Microalb, Ur: 480.78 mg/dL — ABNORMAL HIGH (ref 0.00–1.89)

## 2013-11-18 LAB — VITAMIN D 25 HYDROXY (VIT D DEFICIENCY, FRACTURES): Vit D, 25-Hydroxy: 26 ng/mL — ABNORMAL LOW (ref 30–89)

## 2013-11-25 ENCOUNTER — Encounter: Payer: Self-pay | Admitting: Family Medicine

## 2013-11-25 ENCOUNTER — Ambulatory Visit (INDEPENDENT_AMBULATORY_CARE_PROVIDER_SITE_OTHER): Payer: Medicare Other | Admitting: Family Medicine

## 2013-11-25 VITALS — BP 188/93 | HR 82 | Resp 16 | Ht 65.0 in | Wt 233.0 lb

## 2013-11-25 DIAGNOSIS — E1140 Type 2 diabetes mellitus with diabetic neuropathy, unspecified: Secondary | ICD-10-CM | POA: Insufficient documentation

## 2013-11-25 DIAGNOSIS — IMO0002 Reserved for concepts with insufficient information to code with codable children: Secondary | ICD-10-CM

## 2013-11-25 DIAGNOSIS — F329 Major depressive disorder, single episode, unspecified: Secondary | ICD-10-CM

## 2013-11-25 DIAGNOSIS — E1149 Type 2 diabetes mellitus with other diabetic neurological complication: Secondary | ICD-10-CM

## 2013-11-25 DIAGNOSIS — I10 Essential (primary) hypertension: Secondary | ICD-10-CM

## 2013-11-25 DIAGNOSIS — E1165 Type 2 diabetes mellitus with hyperglycemia: Secondary | ICD-10-CM

## 2013-11-25 DIAGNOSIS — F3289 Other specified depressive episodes: Secondary | ICD-10-CM

## 2013-11-25 DIAGNOSIS — E1142 Type 2 diabetes mellitus with diabetic polyneuropathy: Secondary | ICD-10-CM

## 2013-11-25 DIAGNOSIS — R809 Proteinuria, unspecified: Secondary | ICD-10-CM

## 2013-11-25 DIAGNOSIS — E785 Hyperlipidemia, unspecified: Secondary | ICD-10-CM

## 2013-11-25 MED ORDER — METFORMIN HCL 1000 MG PO TABS: 1000.0000 mg | ORAL_TABLET | Freq: Two times a day (BID) | ORAL | Status: DC

## 2013-11-25 MED ORDER — CARVEDILOL 3.125 MG PO TABS: 3.1250 mg | ORAL_TABLET | Freq: Two times a day (BID) | ORAL | Status: DC

## 2013-11-25 MED ORDER — AMLODIPINE BESYLATE 5 MG PO TABS: 5.0000 mg | ORAL_TABLET | Freq: Every day | ORAL | Status: DC

## 2013-11-25 MED ORDER — DULOXETINE HCL 30 MG PO CPEP: 30.0000 mg | ORAL_CAPSULE | Freq: Every day | ORAL | Status: DC

## 2013-11-25 MED ORDER — LIRAGLUTIDE 18 MG/3ML ~~LOC~~ SOPN: 1.8000 mg | PEN_INJECTOR | Freq: Every day | SUBCUTANEOUS | Status: DC

## 2013-11-25 MED ORDER — PIOGLITAZONE HCL 30 MG PO TABS: 30.0000 mg | ORAL_TABLET | Freq: Every day | ORAL | Status: DC

## 2013-11-25 MED ORDER — LOSARTAN POTASSIUM-HCTZ 100-12.5 MG PO TABS: 1.0000 | ORAL_TABLET | Freq: Two times a day (BID) | ORAL | Status: DC

## 2013-11-25 MED ORDER — ATORVASTATIN CALCIUM 20 MG PO TABS: 20.0000 mg | ORAL_TABLET | Freq: Every day | ORAL | Status: DC

## 2013-11-25 NOTE — Progress Notes (Signed)
Subjective:    Patient ID: Alicia Sampson, female    DOB: [birth year], 63 y.o.   MRN: [id number]  HPI  Alicia Sampson is here today to go over her most recent lab results and to discuss the conditions listed below:   1)  Type II DM - She has been out of her insurance for a couple of months so she has not been taking some of her medications.  She was supposed to be on Victoza, metformin and Actos but has only been taking metformin.  She also admits that she has not been watching her diet very carefully.     2)  Hypertension - She is supposed to be on a combination of amlodipine and losartan/HCTZ.  She does not monitor her blood pressure regularly.    3)  Hyperlipidemia - She is doing fine on atorvastatin 10 mg.    4)  Neuropathy - Her leg pain is controlled with Cymbalta 30 mg.     Review of Systems  Constitutional: Negative for activity change, appetite change, fatigue and unexpected weight change.  HENT: Negative.   Eyes: Negative.   Respiratory: Negative for chest tightness and shortness of breath.   Cardiovascular: Negative for chest pain, palpitations and leg swelling.  Gastrointestinal: Negative for diarrhea and constipation.  Endocrine: Negative.  Negative for polydipsia, polyphagia and polyuria.  Genitourinary: Negative for urgency, frequency and difficulty urinating.  Musculoskeletal: Negative.   Skin: Negative.   Neurological: Negative.  Negative for light-headedness and numbness.  Hematological: Negative for adenopathy. Does not bruise/bleed easily.  Psychiatric/Behavioral: Negative for sleep disturbance and dysphoric mood. The patient is not nervous/anxious.    Past Medical History  Diagnosis Date  . DM type 2 (diabetes mellitus, type 2)   . Retinopathy due to secondary diabetes mellitus   . Neuropathy   . Hypertension   . Hyperlipidemia   . Blindness of left eye     Previous eye doctor thought she may have had some type of CVA that affected her vision.      History reviewed. No pertinent past surgical history.   History   Social History Narrative   Marital Status: Divorced    Children:  Son (1)  Daughter (1)    Pets:  Dog Nurse, learning disability) Mix (Engineer, maintenance)    Living Situation: Lives with her sister.   Occupation: Disabled (Neuropathy)    Education: Production manager & Counseling)    Tobacco:  She has never smoked.     Drug Use:  None   Diet:  Regular   Exercise:  Treadmill   Hobbies: Puzzles/ Coin Collector/ Reading              Family History  Problem Relation Age of Onset  . Multiple myeloma Mother   . Diabetes type II Father   . Hypertension Father   . Stomach cancer Father   . CVA Other      Current Outpatient Prescriptions on File Prior to Visit  Medication Sig Dispense Refill  . gabapentin (NEURONTIN) 300 MG capsule Take 1 capsule (300 mg total) by mouth 2 (two) times daily. Take 1 to 2 pills by mouth QHS  60 capsule  2  . Insulin Pen Needle (NOVOFINE) 32G X 6 MM MISC Use one pen needle daily  100 each  3  . promethazine (PHENERGAN) 12.5 MG tablet Take 1/2 - 1 po q 4-6 hours for nausea  60 tablet  2  . Vitamin D, Ergocalciferol, (DRISDOL) 50000 UNITS CAPS capsule  Take 1 cap by PO twice a week  8 capsule  11  . sitaGLIPtin (JANUVIA) 100 MG tablet Take 100 mg by mouth daily.       No current facility-administered medications on file prior to visit.     No Known Allergies   Immunization History  Administered Date(s) Administered  . H1N1 08/05/2008  . Influenza Whole 06/09/2008, 05/12/2010  . Influenza,inj,Quad PF,36+ Mos 06/20/2013  . Pneumococcal Polysaccharide-23 06/07/2004  . Td 08/07/2002       Objective:   Physical Exam  Vitals reviewed. Constitutional: She is oriented to person, place, and time.  Eyes: Conjunctivae are normal. No scleral icterus.  Neck: Neck supple. No thyromegaly present.  Cardiovascular: Normal rate, regular rhythm and normal heart sounds.    Pulmonary/Chest: Effort normal and breath sounds normal.  Musculoskeletal: She exhibits no edema and no tenderness.  Lymphadenopathy:    She has no cervical adenopathy.  Neurological: She is alert and oriented to person, place, and time.  Skin: Skin is warm and dry.  Psychiatric: She has a normal mood and affect. Her behavior is normal. Judgment and thought content normal.       Assessment & Plan:    Roosevelt was seen today for medication management.  Diagnoses and associated orders for this visit:  Essential hypertension, benign Comments: Her BP remains way too high but she is out of her amlodipine.  We'll add some Coreg 3.125 and recheck her pressure in 3 months.   - losartan-hydrochlorothiazide (HYZAAR) 100-12.5 MG per tablet; Take 1 tablet by mouth 2 (two) times daily. - amLODipine (NORVASC) 5 MG tablet; Take 1 tablet (5 mg total) by mouth daily. - carvedilol (COREG) 3.125 MG tablet; Take 1 tablet (3.125 mg total) by mouth 2 (two) times daily with a meal.  Other and unspecified hyperlipidemia Comments: Her LDL is a little high so we'll increase her dosage to 20 mg.  If she follows Dr. Dimas Chyle diet, she will cut the atorvastatin in 1/2.   - atorvastatin (LIPITOR) 20 MG tablet; Take 1 tablet (20 mg total) by mouth at bedtime.  Type 2 diabetes, uncontrolled, with neuropathy Comments: Jalasia knows that she needs to do better. She is going to get back on Victoza, Actos and Metformin and we'll recheck her A1c in 3 months.  We also discussed bariatric surgery.  I encouraged her to attend a seminar at [place] to see if this would be an option for her.    - metFORMIN (GLUCOPHAGE) 1000 MG tablet; Take 1 tablet (1,000 mg total) by mouth 2 (two) times daily with a meal. - DULoxetine (CYMBALTA) 30 MG capsule; Take 1 capsule (30 mg total) by mouth daily. - pioglitazone (ACTOS) 30 MG tablet; Take 1 tablet (30 mg total) by mouth at bedtime. Nightly - Liraglutide 18 MG/3ML SOPN; Inject  1.8 mg into the skin daily.  Depressive disorder, not elsewhere classified Comments: Refilled her Cymbalta.   - DULoxetine (CYMBALTA) 30 MG capsule; Take 1 capsule (30 mg total) by mouth daily.  Proteinuria Comments: She has had a big increase in her microalbumin which is not surprising since both her sugar and BP are so uncontrolled.  We'll reheck her level in 3 months.    TIME SPENT "FACE TO FACE" WITH PATIENT -  59 MINS

## 2013-11-25 NOTE — Patient Instructions (Addendum)
1)  Type II DM - Get the book "The End of Diabetes" by Dr. Excell Seltzer and follow it.  As your sugars come down you can start coming off of medication in this order - Actos, Victoza then Metformin. We are going to recheck your A1c in 3 months.  Attend seminars and learn about the Sleeve Gastrectomy procedure.    2)  Blood Pressure - Too high so be careful about your sodium intake.  We'll add a new medication called carvedilol 3.125 twice a day to the losartan HCT twice a day and amlodipine.    3)  Cholesterol - Your number is almost perfect.  To make it perfect, I increased your Lipitor to 20 mg HOWEVER, if you are following Dr. Dimas Chyle diet you will not need 20 mg so just cut it in 1/2 and take 10 mg.    4)  Proteinuria - You have a lot of protein in your urine which is due to high sugar and high BP so you need to try and limit animal protein in your diet.  Dr. Dimas Chyle diet is also great for this.     Proteinuria Proteinuria is a condition in which urine contains more protein than is normal. Proteinuria is either a sign that your body is producing too much protein or a sign that there is a problem with the kidneys. Healthy kidneys prevent most substances that the body needs, including proteins, from leaving the bloodstream and ending up in urine. CAUSES  Proteinuria may be caused by a temporary event or condition such as stress, exercise, or fever, and go away on its own. Proteinuria may also be a symptom of a more serious condition or disease. Causes of proteinuria include:  A kidney disease caused by:  Diabetes.  High blood pressure (hypertension).   A disease that affects the immune system, such as lupus.  A genetic disease, such as Alport's syndrome.  Medicines that damage the kidneys, such as long-term nonsteroidal anti-inflammatory drugs (NSAIDs).  Poisoning or exposure to toxic substances.  A reoccurring kidney or urinary infection.  Excess protein production in the body  caused by:  Multiple myeloma.  Amyloidosis. SYMPTOMS You may have proteinuria without having noticeable symptoms. If there is a large amount of protein in your urine, your urine may look foamy. You may also notice swelling (edema) in your hands, feet, abdomen, or face. DIAGNOSIS To determine whether you have proteinuria, you will need to provide a urine sample. Your urine will then be tested for too much protein and the main blood protein albumin. If your test shows that you have proteinuria, you may need to take additional tests to determine its cause, how much protein is in your urine, and what type of protein is being lost. Tests may include:  Blood tests.  Urine tests.  A blood pressure measurement.  Imaging tests. TREATMENT  Treatment will depend on the cause of your proteinuria. Your caregiver will discuss treatment options with you after you have been diagnosed. If your proteinuria is mild or temporary, no treatment may be necessary. HOME CARE INSTRUCTIONS Ask your caregiver if monitoring the level of protein in your urine at home using simple testing strips is appropriate for you. Early detection of proteinuria can lead to early and often successful treatment of the condition causing it. Document Released: 09/13/2005 Document Revised: 04/17/2012 Document Reviewed: 12/22/2011 [facility] Patient Information 2014 [place].

## 2014-02-12 ENCOUNTER — Other Ambulatory Visit: Payer: Self-pay | Admitting: *Deleted

## 2014-02-12 DIAGNOSIS — I10 Essential (primary) hypertension: Secondary | ICD-10-CM

## 2014-02-12 DIAGNOSIS — E119 Type 2 diabetes mellitus without complications: Secondary | ICD-10-CM

## 2014-02-16 ENCOUNTER — Other Ambulatory Visit: Payer: Medicare Other

## 2014-02-16 LAB — CBC WITH DIFFERENTIAL/PLATELET
Basophils Absolute: 0 10*3/uL (ref 0.0–0.1)
Basophils Relative: 1 % (ref 0–1)
Eosinophils Absolute: 0.1 10*3/uL (ref 0.0–0.7)
Eosinophils Relative: 2 % (ref 0–5)
HCT: 38.6 % (ref 36.0–46.0)
Hemoglobin: 13 g/dL (ref 12.0–15.0)
Lymphocytes Relative: 39 % (ref 12–46)
Lymphs Abs: 1.7 10*3/uL (ref 0.7–4.0)
MCH: 29 pg (ref 26.0–34.0)
MCHC: 33.7 g/dL (ref 30.0–36.0)
MCV: 86.2 fL (ref 78.0–100.0)
Monocytes Absolute: 0.3 10*3/uL (ref 0.1–1.0)
Monocytes Relative: 7 % (ref 3–12)
Neutro Abs: 2.2 10*3/uL (ref 1.7–7.7)
Neutrophils Relative %: 51 % (ref 43–77)
Platelets: 264 10*3/uL (ref 150–400)
RBC: 4.48 MIL/uL (ref 3.87–5.11)
RDW: 14.1 % (ref 11.5–15.5)
WBC: 4.4 10*3/uL (ref 4.0–10.5)

## 2014-02-17 LAB — COMPLETE METABOLIC PANEL WITH GFR
ALT: 11 U/L (ref 0–35)
AST: 15 U/L (ref 0–37)
Albumin: 3.5 g/dL (ref 3.5–5.2)
Alkaline Phosphatase: 81 U/L (ref 39–117)
BUN: 15 mg/dL (ref 6–23)
CO2: 25 mEq/L (ref 19–32)
Calcium: 8.4 mg/dL (ref 8.4–10.5)
Chloride: 102 mEq/L (ref 96–112)
Creat: 0.99 mg/dL (ref 0.50–1.10)
GFR, Est African American: 71 mL/min
GFR, Est Non African American: 62 mL/min
Glucose, Bld: 306 mg/dL — ABNORMAL HIGH (ref 70–99)
Potassium: 3.9 mEq/L (ref 3.5–5.3)
Sodium: 138 mEq/L (ref 135–145)
Total Bilirubin: 1 mg/dL (ref 0.2–1.2)
Total Protein: 6.4 g/dL (ref 6.0–8.3)

## 2014-02-17 LAB — HEMOGLOBIN A1C
Hgb A1c MFr Bld: 11.8 % — ABNORMAL HIGH (ref <5.7)
Mean Plasma Glucose: 292 mg/dL — ABNORMAL HIGH (ref <117)

## 2014-02-23 ENCOUNTER — Ambulatory Visit: Payer: Medicare Other | Admitting: Family Medicine

## 2014-02-24 ENCOUNTER — Ambulatory Visit: Payer: Medicare Other | Admitting: Family Medicine

## 2014-02-26 ENCOUNTER — Ambulatory Visit: Payer: Medicare Other | Admitting: Family Medicine

## 2014-05-18 ENCOUNTER — Encounter: Payer: Self-pay | Admitting: Family

## 2014-05-18 ENCOUNTER — Ambulatory Visit (INDEPENDENT_AMBULATORY_CARE_PROVIDER_SITE_OTHER): Payer: Medicare Other | Admitting: Family

## 2014-05-18 ENCOUNTER — Other Ambulatory Visit: Payer: Self-pay | Admitting: *Deleted

## 2014-05-18 VITALS — BP 170/102 | HR 72 | Temp 98.2°F | Resp 18 | Ht 66.0 in | Wt 226.4 lb

## 2014-05-18 DIAGNOSIS — E1140 Type 2 diabetes mellitus with diabetic neuropathy, unspecified: Secondary | ICD-10-CM

## 2014-05-18 DIAGNOSIS — IMO0002 Reserved for concepts with insufficient information to code with codable children: Secondary | ICD-10-CM

## 2014-05-18 DIAGNOSIS — F329 Major depressive disorder, single episode, unspecified: Secondary | ICD-10-CM

## 2014-05-18 DIAGNOSIS — E785 Hyperlipidemia, unspecified: Secondary | ICD-10-CM

## 2014-05-18 DIAGNOSIS — Z23 Encounter for immunization: Secondary | ICD-10-CM

## 2014-05-18 DIAGNOSIS — E1165 Type 2 diabetes mellitus with hyperglycemia: Principal | ICD-10-CM

## 2014-05-18 DIAGNOSIS — I10 Essential (primary) hypertension: Secondary | ICD-10-CM

## 2014-05-18 DIAGNOSIS — F32A Depression, unspecified: Secondary | ICD-10-CM

## 2014-05-18 LAB — BASIC METABOLIC PANEL
BUN: 19 mg/dL (ref 6–23)
CHLORIDE: 99 meq/L (ref 96–112)
CO2: 27 meq/L (ref 19–32)
Calcium: 8.5 mg/dL (ref 8.4–10.5)
Creatinine, Ser: 1.3 mg/dL — ABNORMAL HIGH (ref 0.4–1.2)
GFR: 53.84 mL/min — ABNORMAL LOW (ref >60.00)
Glucose, Bld: 441 mg/dL — ABNORMAL HIGH (ref 70–99)
Potassium: 4 mEq/L (ref 3.5–5.1)
Sodium: 135 mEq/L (ref 135–145)

## 2014-05-18 LAB — HEPATIC FUNCTION PANEL
ALT: 10 U/L (ref 0–35)
AST: 14 U/L (ref 0–37)
Albumin: 2.6 g/dL — ABNORMAL LOW (ref 3.5–5.2)
Alkaline Phosphatase: 82 U/L (ref 39–117)
BILIRUBIN DIRECT: 0.1 mg/dL (ref 0.0–0.3)
BILIRUBIN TOTAL: 0.7 mg/dL (ref 0.2–1.2)
Total Protein: 6.6 g/dL (ref 6.0–8.3)

## 2014-05-18 LAB — LIPID PANEL
CHOL/HDL RATIO: 5
Cholesterol: 218 mg/dL — ABNORMAL HIGH (ref 0–200)
HDL: 40.3 mg/dL (ref >39.00)
LDL CALC: 153 mg/dL — AB (ref 0–99)
NONHDL: 177.7
Triglycerides: 126 mg/dL (ref 0.0–149.0)
VLDL: 25.2 mg/dL (ref 0.0–40.0)

## 2014-05-18 LAB — HEMOGLOBIN A1C: Hgb A1c MFr Bld: 12.9 % — ABNORMAL HIGH (ref 4.6–6.5)

## 2014-05-18 MED ORDER — PIOGLITAZONE HCL 30 MG PO TABS: 30.0000 mg | ORAL_TABLET | Freq: Every day | ORAL | Status: AC

## 2014-05-18 MED ORDER — ATORVASTATIN CALCIUM 20 MG PO TABS: 20.0000 mg | ORAL_TABLET | Freq: Every day | ORAL | Status: AC

## 2014-05-18 MED ORDER — LOSARTAN POTASSIUM-HCTZ 100-12.5 MG PO TABS: 1.0000 | ORAL_TABLET | Freq: Two times a day (BID) | ORAL | Status: DC

## 2014-05-18 MED ORDER — METFORMIN HCL 1000 MG PO TABS: 1000.0000 mg | ORAL_TABLET | Freq: Two times a day (BID) | ORAL | Status: AC

## 2014-05-18 MED ORDER — CARVEDILOL 3.125 MG PO TABS: 3.1250 mg | ORAL_TABLET | Freq: Two times a day (BID) | ORAL | Status: AC

## 2014-05-18 MED ORDER — AMLODIPINE BESYLATE 5 MG PO TABS: 5.0000 mg | ORAL_TABLET | Freq: Every day | ORAL | Status: AC

## 2014-05-18 MED ORDER — DULOXETINE HCL 30 MG PO CPEP: 30.0000 mg | ORAL_CAPSULE | Freq: Every day | ORAL | Status: AC

## 2014-05-18 MED ORDER — SITAGLIPTIN PHOSPHATE 100 MG PO TABS
100.0000 mg | ORAL_TABLET | Freq: Every day | ORAL | Status: AC
Start: 2014-05-18 — End: ?

## 2014-05-18 NOTE — Patient Instructions (Signed)
Please complete lab work prior to leaving. Sign up for mychart so that we can communicate more easily. Restart medications. Follow up in 1 month.

## 2014-05-18 NOTE — Assessment & Plan Note (Signed)
Repeat flp, hepatic function, continue statin.

## 2014-05-18 NOTE — Assessment & Plan Note (Addendum)
Uncontrolled. Resume BP meds.   

## 2014-05-18 NOTE — Addendum Note (Signed)
Addended by: Eustace QuailEABOLD, Pennie Vanblarcom J on: 05/18/2014 11:40 AM   Modules accepted: Orders

## 2014-05-18 NOTE — Addendum Note (Signed)
Addended by: Mervin KungFERGERSON, Maurion Walkowiak A on: 05/18/2014 11:33 AM   Modules accepted: Orders

## 2014-05-18 NOTE — Progress Notes (Signed)
Pre visit review using our clinic review tool, if applicable. No additional management support is needed unless otherwise documented below in the visit note. 

## 2014-05-18 NOTE — Progress Notes (Signed)
Subjective:    Patient ID: Alicia Sampson, female    DOB: Aug 01, 1951, 63 y.o.   MRN: [id number]  HPI  [name] is a 63 yr old female who presents today to establish care.  She was previously followed by Dr. Dion Saucier and reports that she has been out of her medications for 1 month.   1) DM2- last eye exam "doesn't remember." reports that she was diagnosed in 1998 Lab Results  Component Value Date   HGBA1C 11.8* 02/16/2014   HGBA1C 11.6* 11/17/2013   HGBA1C 12.1 06/20/2013   Lab Results  Component Value Date   MICROALBUR 480.78* 11/17/2013   LDLCALC 108* 11/17/2013   CREATININE 0.99 02/16/2014   2) HTN-  BP meds include hyzaar and amlodipine. Diagnosed 1997 BP Readings from Last 3 Encounters:  05/18/14 170/102  11/25/13 188/93  06/20/13 186/91    3) Hyperlipidemia- maintained on atorvastatin.  Last LDL was 108.  Denies myalgia.    4) Depression- on cymbalta. In the past she has taken trazadone.  Reports depression "my entire adult life." treated x 5 years.  Reports that she feels well on cymbalta.      Review of Systems  Constitutional:       Wt Readings from Last 3 Encounters: 05/18/14 : 226 lb 6.4 oz (102.694 kg) 11/25/13 : 233 lb (105.688 kg) 06/20/13 : 232 lb (105.235 kg)   HENT: Negative for hearing loss and rhinorrhea.   Eyes:       Wears reading glasses  Respiratory: Negative for cough and shortness of breath.   Cardiovascular: Negative for chest pain.  Gastrointestinal: Negative for nausea, vomiting and diarrhea.  Genitourinary: Positive for frequency. Negative for dysuria.  Musculoskeletal: Negative for arthralgias and myalgias.  Skin: Negative for rash.  Neurological: Negative for headaches.  Hematological: Negative for adenopathy.  Psychiatric/Behavioral:       Reports + depression      see HPI  Past Medical History  Diagnosis Date  . DM type 2 (diabetes mellitus, type 2)   . Retinopathy due to secondary diabetes mellitus   . Neuropathy    . Hypertension   . Hyperlipidemia   . Blindness of left eye     Previous eye doctor thought she may have had some type of CVA that affected her vision.    History   Social History  . Marital Status: Single    Spouse Name: N/A    Number of Children: N/A  . Years of Education: N/A   Occupational History  . Retired    Social History Main Topics  . Smoking status: Never Smoker   . Smokeless tobacco: Never Used  . Alcohol Use: Not on file  . Drug Use: No  . Sexual Activity: Yes    Partners: Female   Other Topics Concern  . Not on file   Social History Narrative   Marital Status: Divorced    Children:  Son (1)  Daughter (1)    Pets:  Dog Nurse, learning disability) Mix (Engineer, maintenance)    Living Situation: Lives with her sister.   Occupation: Disabled (Neuropathy)    Education: Production manager & Counseling)    Tobacco:  She has never smoked.     Drug Use:  None   Diet:  Regular   Exercise:  Treadmill   Hobbies: Puzzles/ Coin Collector/ Reading             History reviewed. No pertinent past surgical history.  Family History  Problem Relation  Age of Onset  . Multiple myeloma Mother   . Diabetes type II Father   . Hypertension Father   . Stomach cancer Father   . CVA Other     No Known Allergies  Current Outpatient Prescriptions on File Prior to Visit  Medication Sig Dispense Refill  . amLODipine (NORVASC) 5 MG tablet Take 1 tablet (5 mg total) by mouth daily.  90 tablet  0  . atorvastatin (LIPITOR) 20 MG tablet Take 1 tablet (20 mg total) by mouth at bedtime.  30 tablet  2  . carvedilol (COREG) 3.125 MG tablet Take 1 tablet (3.125 mg total) by mouth 2 (two) times daily with a meal.  180 tablet  0  . DULoxetine (CYMBALTA) 30 MG capsule Take 1 capsule (30 mg total) by mouth daily.  30 capsule  5  . gabapentin (NEURONTIN) 300 MG capsule Take 1 capsule (300 mg total) by mouth 2 (two) times daily. Take 1 to 2 pills by mouth QHS  60 capsule  2  .  losartan-hydrochlorothiazide (HYZAAR) 100-12.5 MG per tablet Take 1 tablet by mouth 2 (two) times daily.  60 tablet  2  . metFORMIN (GLUCOPHAGE) 1000 MG tablet Take 1 tablet (1,000 mg total) by mouth 2 (two) times daily with a meal.  60 tablet  2  . pioglitazone (ACTOS) 30 MG tablet Take 1 tablet (30 mg total) by mouth at bedtime. Nightly  30 tablet  2  . sitaGLIPtin (JANUVIA) 100 MG tablet Take 100 mg by mouth daily.      . Vitamin D, Ergocalciferol, (DRISDOL) 50000 UNITS CAPS capsule Take 1 cap by PO twice a week  8 capsule  11   No current facility-administered medications on file prior to visit.    BP 170/102  Pulse 72  Temp(Src) 98.2 F (36.8 C) (Oral)  Resp 18  Ht 5' 6"  (1.676 m)  Wt 226 lb 6.4 oz (102.694 kg)  BMI 36.56 kg/m2  SpO2 100%    Objective:   Physical Exam  Constitutional: She is oriented to person, place, and time. She appears well-developed and well-nourished. No distress.  HENT:  Head: Normocephalic and atraumatic.  Cardiovascular: Normal rate and regular rhythm.   No murmur heard. Pulmonary/Chest: Effort normal and breath sounds normal. No respiratory distress. She has no wheezes. She has no rales. She exhibits no tenderness.  Abdominal: Soft. She exhibits no distension.  Musculoskeletal: She exhibits no edema.  Neurological: She is alert and oriented to person, place, and time.  Skin: Skin is warm and dry.  Psychiatric: She has a normal mood and affect. Her behavior is normal. Judgment and thought content normal.          Assessment & Plan:

## 2014-05-18 NOTE — Assessment & Plan Note (Signed)
Stable on cymbalta. Monitor.

## 2014-05-18 NOTE — Assessment & Plan Note (Addendum)
Resume meds.  Discussed the importance of checking sugars regularly and working on diet.  We discussed that if her A1C is still very uncontrolled, that we will likely need to add a long acting insulin to her regimen. Flu shot today, refer for eye exam.

## 2014-05-19 ENCOUNTER — Telehealth: Payer: Self-pay | Admitting: Family

## 2014-05-19 MED ORDER — INSULIN PEN NEEDLE 31G X 5 MM MISC: Status: AC

## 2014-05-19 MED ORDER — INSULIN GLARGINE 100 UNIT/ML SOLOSTAR PEN: 20.0000 [IU] | PEN_INJECTOR | Freq: Every day | SUBCUTANEOUS | Status: AC

## 2014-05-19 NOTE — Telephone Encounter (Signed)
Notified pt, she is currently in [place] and will not return until Monday, 05/25/14. Scheduled nurse visit for 05/27/14 at 3:30pm and pt will bring insulin with her.

## 2014-05-19 NOTE — Telephone Encounter (Signed)
Labs show diabetes remains very uncontrolled.  I would recommend addition of lantus insulin 15 units once daily in the evenings. Please have pt return for nurse visit teaching.  She should check sugar twice daily while on insulin, call if sugar <70 or >300. Cholesterol is elevated, restart atorvastatin. Kidney function has worsened some, which makes it that much more important that we get her sugar under control.

## 2014-05-19 NOTE — Telephone Encounter (Signed)
Attempted to reach pt and received message that voice mailbox has not been set up.

## 2014-05-26 ENCOUNTER — Ambulatory Visit: Payer: Medicare Other

## 2014-06-01 ENCOUNTER — Telehealth: Payer: Self-pay | Admitting: *Deleted

## 2014-06-01 ENCOUNTER — Ambulatory Visit (INDEPENDENT_AMBULATORY_CARE_PROVIDER_SITE_OTHER): Payer: Medicare Other | Admitting: *Deleted

## 2014-06-01 DIAGNOSIS — E1140 Type 2 diabetes mellitus with diabetic neuropathy, unspecified: Secondary | ICD-10-CM

## 2014-06-01 DIAGNOSIS — IMO0002 Reserved for concepts with insufficient information to code with codable children: Secondary | ICD-10-CM

## 2014-06-01 DIAGNOSIS — E1165 Type 2 diabetes mellitus with hyperglycemia: Secondary | ICD-10-CM

## 2014-06-01 NOTE — Progress Notes (Signed)
Pt here for nurse visit diabetic teaching. Pt will start Lantus 15 units at home this evening (pt did not bring insulin with her today). Instructed pt on proper injection technique, call back parameters and symptoms of hypo and hyperglycemia. Pt verbalizes understanding and demonstrates proper injection technique in the office. Advised pt to check glucose twice a day per 05/22/14 phone note and to call us in 1 week with readings.  Encouraged pt to call the office if she has any questions re: injection once she starts lantus.

## 2014-06-01 NOTE — Telephone Encounter (Signed)
[facility] losartan hctz will need quantity limit override for bid dosing and they are faxing info to us.

## 2014-06-02 NOTE — Telephone Encounter (Signed)
PA initiated. Awaiting determination. JG//CMA 

## 2014-06-03 MED ORDER — HYDROCHLOROTHIAZIDE 25 MG PO TABS: 25.0000 mg | ORAL_TABLET | Freq: Every day | ORAL | Status: AC

## 2014-06-03 MED ORDER — LOSARTAN POTASSIUM 100 MG PO TABS: 100.0000 mg | ORAL_TABLET | Freq: Every day | ORAL | Status: AC

## 2014-06-03 NOTE — Telephone Encounter (Signed)
Attempted to reach pt but voicemail has not been set up yet. Unable to leave message.

## 2014-06-03 NOTE — Telephone Encounter (Signed)
Quantity limit request denied by [facility]. Please advise. JG//CMA

## 2014-06-03 NOTE — Telephone Encounter (Signed)
D/C hyzaar. I will break up into losartan 100mg  once daily and hctz 25mg  once daily.   (100mg  is technically top dose for losartan)  Keep upcoming appointment.

## 2014-06-03 NOTE — Telephone Encounter (Signed)
Notified pt and she voices understanding. States she paid out of pocket for the losartan hctz yesterday. Advised pt to complete current month supply then start the two separate Rxs that were sent to the pharmacy today.

## 2014-06-18 ENCOUNTER — Ambulatory Visit: Payer: Medicare Other | Admitting: Family

## 2015-03-01 ENCOUNTER — Other Ambulatory Visit: Payer: Self-pay

## 2015-03-01 NOTE — Telephone Encounter (Signed)
Opened in error

## 2016-02-28 LAB — HM DIABETES EYE EXAM

## 2016-03-29 ENCOUNTER — Encounter: Payer: Self-pay | Admitting: Family

## 2017-06-23 MED ORDER — ATORVASTATIN CALCIUM 40 MG PO TABS
40.00 mg | ORAL_TABLET | ORAL | Status: DC
Start: 2017-06-24 — End: 2017-06-23

## 2017-06-23 MED ORDER — PANCRELIPASE (LIP-PROT-AMYL) 5000-24000 UNITS PO CPEP
5000.00 | ORAL_CAPSULE | ORAL | Status: DC
Start: 2017-06-23 — End: 2017-06-23

## 2017-06-23 MED ORDER — LEVOFLOXACIN 500 MG PO TABS
500.00 mg | ORAL_TABLET | ORAL | Status: DC
Start: 2017-06-23 — End: 2017-06-23

## 2017-06-23 MED ORDER — HYDRALAZINE HCL 50 MG PO TABS
100.00 mg | ORAL_TABLET | ORAL | Status: DC
Start: 2017-06-23 — End: 2017-06-23

## 2017-06-23 MED ORDER — CLONIDINE HCL 0.2 MG PO TABS
0.20 mg | ORAL_TABLET | ORAL | Status: DC
Start: 2017-06-23 — End: 2017-06-23

## 2017-06-23 MED ORDER — LABETALOL HCL 200 MG PO TABS
400.00 mg | ORAL_TABLET | ORAL | Status: DC
Start: 2017-06-23 — End: 2017-06-23

## 2017-06-23 MED ORDER — DULOXETINE HCL 20 MG PO CPEP
20.00 mg | ORAL_CAPSULE | ORAL | Status: DC
Start: 2017-06-24 — End: 2017-06-23

## 2017-06-23 MED ORDER — LOSARTAN POTASSIUM 50 MG PO TABS
100.00 mg | ORAL_TABLET | ORAL | Status: DC
Start: 2017-06-24 — End: 2017-06-23

## 2017-06-23 MED ORDER — ALBUTEROL SULFATE 1.25 MG/3ML IN NEBU
1.25 mg | INHALATION_SOLUTION | RESPIRATORY_TRACT | Status: DC
Start: ? — End: 2017-06-23

## 2017-06-23 MED ORDER — CHOLECALCIFEROL 25 MCG (1000 UT) PO TABS
1000.00 | ORAL_TABLET | ORAL | Status: DC
Start: 2017-06-24 — End: 2017-06-23

## 2017-06-23 MED ORDER — FUROSEMIDE 40 MG PO TABS
40.00 mg | ORAL_TABLET | ORAL | Status: DC
Start: 2017-06-23 — End: 2017-06-23

## 2017-06-23 MED ORDER — GENERIC EXTERNAL MEDICATION
4.00 | Status: DC
Start: 2017-06-23 — End: 2017-06-23

## 2017-06-23 MED ORDER — ASPIRIN EC 325 MG PO TBEC
325.00 mg | DELAYED_RELEASE_TABLET | ORAL | Status: DC
Start: 2017-06-24 — End: 2017-06-23

## 2017-06-23 MED ORDER — FERROUS SULFATE 325 (65 FE) MG PO TABS
325.00 mg | ORAL_TABLET | ORAL | Status: DC
Start: 2017-06-24 — End: 2017-06-23

## 2017-06-23 MED ORDER — AMLODIPINE BESYLATE 5 MG PO TABS
10.00 mg | ORAL_TABLET | ORAL | Status: DC
Start: 2017-06-24 — End: 2017-06-23

## 2017-06-23 MED ORDER — ACETAMINOPHEN 325 MG PO TABS
650.00 mg | ORAL_TABLET | ORAL | Status: DC
Start: ? — End: 2017-06-23

## 2017-06-23 MED ORDER — HEPARIN SODIUM (PORCINE) 5000 UNIT/ML IJ SOLN
5000.00 | INTRAMUSCULAR | Status: DC
Start: 2017-06-23 — End: 2017-06-23

## 2017-06-23 MED ORDER — IPRATROPIUM BROMIDE 0.02 % IN SOLN
0.50 mg | RESPIRATORY_TRACT | Status: DC
Start: ? — End: 2017-06-23

## 2017-06-23 MED ORDER — GUAIFENESIN ER 600 MG PO TB12
600.00 mg | ORAL_TABLET | ORAL | Status: DC
Start: 2017-06-23 — End: 2017-06-23

## 2018-01-30 MED ORDER — ASPIRIN EC 325 MG PO TBEC
325.00 | DELAYED_RELEASE_TABLET | ORAL | Status: DC
Start: 2018-01-31 — End: 2018-01-30

## 2018-01-30 MED ORDER — CLONIDINE HCL 0.2 MG PO TABS
0.20 | ORAL_TABLET | ORAL | Status: DC
Start: 2018-01-30 — End: 2018-01-30

## 2018-01-30 MED ORDER — ONDANSETRON HCL 4 MG/2ML IJ SOLN
4.00 | INTRAMUSCULAR | Status: DC
Start: ? — End: 2018-01-30

## 2018-01-30 MED ORDER — LABETALOL HCL 200 MG PO TABS
400.00 | ORAL_TABLET | ORAL | Status: DC
Start: 2018-01-30 — End: 2018-01-30

## 2018-01-30 MED ORDER — TRAMADOL HCL 50 MG PO TABS
50.00 | ORAL_TABLET | ORAL | Status: DC
Start: ? — End: 2018-01-30

## 2018-01-30 MED ORDER — ATORVASTATIN CALCIUM 40 MG PO TABS
40.00 | ORAL_TABLET | ORAL | Status: DC
Start: 2018-01-31 — End: 2018-01-30

## 2018-01-30 MED ORDER — ACETAMINOPHEN 325 MG PO TABS
650.00 | ORAL_TABLET | ORAL | Status: DC
Start: ? — End: 2018-01-30

## 2018-01-30 MED ORDER — ALBUMIN HUMAN 25 % IV SOLN
12.50 g | INTRAVENOUS | Status: DC
Start: ? — End: 2018-01-30

## 2018-01-30 MED ORDER — CHOLECALCIFEROL 25 MCG (1000 UT) PO TABS
1000.00 | ORAL_TABLET | ORAL | Status: DC
Start: 2018-01-31 — End: 2018-01-30

## 2018-01-30 MED ORDER — FERROUS SULFATE 325 (65 FE) MG PO TABS
325.00 | ORAL_TABLET | ORAL | Status: DC
Start: 2018-01-31 — End: 2018-01-30

## 2018-01-30 MED ORDER — GENERIC EXTERNAL MEDICATION
5.00 | Status: DC
Start: ? — End: 2018-01-30

## 2018-01-30 MED ORDER — HEPARIN SODIUM (PORCINE) 5000 UNIT/ML IJ SOLN
5000.00 | INTRAMUSCULAR | Status: DC
Start: 2018-01-30 — End: 2018-01-30

## 2018-07-15 NOTE — Telephone Encounter (Signed)
This encounter was created in error - please disregard.
